# Patient Record
Sex: Female | Born: 1965 | Race: White | Hispanic: No | Marital: Married | Smoking: Never smoker
Health system: Southern US, Community
[De-identification: ages and names within clinical notes are randomized; demographics above are authoritative.]

## PROBLEM LIST (undated history)

## (undated) DIAGNOSIS — N200 Calculus of kidney: Secondary | ICD-10-CM

## (undated) DIAGNOSIS — R3129 Other microscopic hematuria: Secondary | ICD-10-CM

## (undated) DIAGNOSIS — E1165 Type 2 diabetes mellitus with hyperglycemia: Secondary | ICD-10-CM

## (undated) DIAGNOSIS — I10 Essential (primary) hypertension: Secondary | ICD-10-CM

## (undated) DIAGNOSIS — E785 Hyperlipidemia, unspecified: Secondary | ICD-10-CM

## (undated) DIAGNOSIS — Z6841 Body Mass Index (BMI) 40.0 and over, adult: Secondary | ICD-10-CM

## (undated) HISTORY — DX: Other microscopic hematuria: R31.29

## (undated) HISTORY — DX: Body Mass Index (BMI) 40.0 and over, adult: Z684

## (undated) HISTORY — DX: Morbid (severe) obesity due to excess calories: E66.01

## (undated) HISTORY — DX: Type 2 diabetes mellitus with hyperglycemia: E11.65

## (undated) HISTORY — DX: Hyperlipidemia, unspecified: E78.5

## (undated) HISTORY — PX: OTHER SURGICAL HISTORY: SHX169

## (undated) HISTORY — DX: Calculus of kidney: N20.0

## (undated) HISTORY — DX: Essential (primary) hypertension: I10

---

## 2006-08-19 ENCOUNTER — Ambulatory Visit: Payer: Self-pay | Admitting: Obstetrics & Gynecology

## 2006-08-26 ENCOUNTER — Ambulatory Visit: Payer: Self-pay | Admitting: Obstetrics & Gynecology

## 2007-02-23 ENCOUNTER — Ambulatory Visit: Payer: Self-pay | Admitting: Obstetrics & Gynecology

## 2007-02-24 ENCOUNTER — Emergency Department: Payer: Self-pay | Admitting: Emergency Medicine

## 2007-02-25 ENCOUNTER — Other Ambulatory Visit: Payer: Self-pay

## 2007-10-11 ENCOUNTER — Ambulatory Visit: Payer: Self-pay | Admitting: Obstetrics & Gynecology

## 2007-10-21 ENCOUNTER — Emergency Department: Payer: Self-pay | Admitting: Emergency Medicine

## 2007-11-10 ENCOUNTER — Ambulatory Visit: Payer: Self-pay | Admitting: Specialist

## 2008-10-18 ENCOUNTER — Ambulatory Visit: Payer: Self-pay | Admitting: Family Medicine

## 2008-10-18 DIAGNOSIS — M722 Plantar fascial fibromatosis: Secondary | ICD-10-CM | POA: Insufficient documentation

## 2008-10-18 DIAGNOSIS — I10 Essential (primary) hypertension: Secondary | ICD-10-CM | POA: Insufficient documentation

## 2008-10-18 DIAGNOSIS — M67919 Unspecified disorder of synovium and tendon, unspecified shoulder: Secondary | ICD-10-CM | POA: Insufficient documentation

## 2008-10-18 DIAGNOSIS — M719 Bursopathy, unspecified: Secondary | ICD-10-CM

## 2008-10-19 ENCOUNTER — Encounter: Payer: Self-pay | Admitting: Family Medicine

## 2008-10-22 ENCOUNTER — Encounter: Payer: Self-pay | Admitting: Family Medicine

## 2008-12-04 ENCOUNTER — Ambulatory Visit: Payer: Self-pay | Admitting: Family Medicine

## 2008-12-04 DIAGNOSIS — R109 Unspecified abdominal pain: Secondary | ICD-10-CM | POA: Insufficient documentation

## 2008-12-04 LAB — CONVERTED CEMR LAB
Bilirubin Urine: NEGATIVE
Glucose, Urine, Semiquant: NEGATIVE
Ketones, urine, test strip: NEGATIVE
Specific Gravity, Urine: 1.01

## 2008-12-05 ENCOUNTER — Encounter: Payer: Self-pay | Admitting: Family Medicine

## 2009-07-09 ENCOUNTER — Telehealth: Payer: Self-pay | Admitting: Family Medicine

## 2009-09-06 ENCOUNTER — Telehealth: Payer: Self-pay | Admitting: Family Medicine

## 2010-09-09 NOTE — Progress Notes (Signed)
Summary: pharmacy wants to change micardis  Phone Note From Pharmacy Message from:  Fax from Pharmacy  [facility]er: [facility] Summary of Call: Pharmacy wants to change pt from micardis, form is on your desk. Initial call taken by: Lowella Petties CMA,  September 06, 2009 4:36 PM  Follow-up for Phone Call        this is up to the patient, not pharmacy. ask her. Follow-up by: Hannah Beat MD,  September 07, 2009 4:59 PM  Additional Follow-up for Phone Call Additional follow up Details #1::        please ask the patient.   if she switched to losartan, her copay would be 15 dollars Additional Follow-up by: Hannah Beat MD,  September 09, 2009 2:38 PM    Additional Follow-up for Phone Call Additional follow up Details #2::    Left message for patient to call back. Lewanda Rife LPN  September 11, 2009 10:00 AM   Spoke with patient, she is ok to make the change however she wants to be sure there are no crazy or weird side effects.  She said by making the switch she can save over $100 a month but wants to be sure Dr. Patsy Lager thinks she will do well being on this medication.  Please advise. Follow-up by: Linde Gillis CMA Duncan Dull),  September 11, 2009 4:39 PM  Additional Follow-up for Phone Call Additional follow up Details #3:: Details for Additional Follow-up Action Taken: It is in the same class of drug. No different SE compared with Micardis. Impossible to know if she will do well, but it works the same as what she is currently on.   Sent 09/12/2009. 5 PM Additional Follow-up by: Hannah Beat MD,  September 11, 2009 7:19 PM  New/Updated Medications: LOSARTAN POTASSIUM 50 MG TABS (LOSARTAN POTASSIUM) 1 by mouth daily Prescriptions: LOSARTAN POTASSIUM 50 MG TABS (LOSARTAN POTASSIUM) 1 by mouth daily  #90 x 3   Entered and Authorized by:   Hannah Beat MD   Signed by:   Hannah Beat MD on 09/12/2009   Method used:   Electronically to        [facility] [address]  (mail-order)       [facility]       [address]       [place], [place]  [address]       Ph: [phone]       Fax: [phone]   RxID:   [id number]  Spoke with patient and advised her as instructed, she would like to switch to Losartan.  Please send in Rx to [facility].  Linde Gillis CMA Duncan Dull)  September 12, 2009 11:28 AM

## 2011-01-19 ENCOUNTER — Ambulatory Visit (INDEPENDENT_AMBULATORY_CARE_PROVIDER_SITE_OTHER): Payer: 59 | Admitting: Family Medicine

## 2011-01-19 ENCOUNTER — Encounter: Payer: Self-pay | Admitting: Family Medicine

## 2011-01-19 ENCOUNTER — Ambulatory Visit
Admission: RE | Admit: 2011-01-19 | Discharge: 2011-01-19 | Disposition: A | Discharge status: Home / Self Care | Payer: 59 | Source: Ambulatory Visit | Attending: Family Medicine | Admitting: Family Medicine

## 2011-01-19 DIAGNOSIS — R220 Localized swelling, mass and lump, head: Secondary | ICD-10-CM

## 2011-01-19 DIAGNOSIS — J029 Acute pharyngitis, unspecified: Secondary | ICD-10-CM | POA: Insufficient documentation

## 2011-01-19 DIAGNOSIS — R221 Localized swelling, mass and lump, neck: Secondary | ICD-10-CM | POA: Insufficient documentation

## 2011-01-19 LAB — T4, FREE: Free T4: 0.68 ng/dL (ref 0.60–1.60)

## 2011-01-19 LAB — TSH: TSH: 1.18 u[IU]/mL (ref 0.35–5.50)

## 2011-01-19 NOTE — Patient Instructions (Signed)
You have pharyngitis, likely viral. Push fluids and plenty of rest. May use ibuprofen or tylenol for throat inflammation. Salt water gargles. Suck on cold things like popsicles or warm things like herbal teas (whichever soothes the throat better). Return if fever >101.5, worsening pain, or trouble opening/closing mouth, or hoarse voice.  For swelling in neck - blood work today to check thyroid as well as pass by Joyce Rowe's office to schedule thyroid ultrasound. Good to see you today, call clinic with questions.

## 2011-01-19 NOTE — Assessment & Plan Note (Addendum)
Swelling in neck - thyromegaly vs adipose tissue but pt endorsing recent increase in swelling. Nontender on exam so don't think due to thyroiditis but will check for this. Today tachycardic and hypertensive, pt attributes to anxiety. Given neck findings, have set up with thyroid function labs as well as ultrasound to eval.

## 2011-01-19 NOTE — Assessment & Plan Note (Signed)
Rapid strep checked today - negative. Anticipate viral pharyngitis component- treat with supportive care as per instructions. See below regarding neck swelling.

## 2011-01-19 NOTE — Progress Notes (Signed)
  Subjective:    Patient ID: Joyce Rowe, female    DOB: 1966/07/16, 45 y.o.   MRN: [id number]  HPI CC: ST?  1 night h/o ST raw (last night) as well as wheezing (last night).  This in setting of last few weeks with swelling in throat, attributed to allergies.  Taking allergy medicines for this, not really helping.  No HA, fevers/chills, nausea/vomiting, diarrhea, abd pain, rashes.  No ear pain, tooth pain, sinus pain.  Denies cough, SOB.  Denies rashes, skin or hair changes, diarrhea/constipation, cold intolerance. Endorses longstanding heat intolerance.  Went to [facility] at [employer] for daughter (jellyfish sting), Dr told her that her thyroid was "inflammed" just by looking at her.  No sick contacts at home, no smokers at home.  + h/o allergies.  No h/o asthma.  HR 120 today, elevated BP.  Pt states this is norm for her when coming in to see doctor.  Has appt with PCP on Wednesday for CPX. BP Readings from Last 3 Encounters:  01/19/11 154/100  12/04/08 130/90  10/18/08 150/88   Medications and allergies reviewed and updated in chart. PMHx reviewed for relevance. Review of Systems Per HPI    Objective:   Physical Exam  Nursing note and vitals reviewed. Constitutional: She appears well-developed and well-nourished. No distress.       Anxious  HENT:  Head: Normocephalic and atraumatic.  Right Ear: Hearing, tympanic membrane, external ear and ear canal normal.  Left Ear: Hearing, tympanic membrane, external ear and ear canal normal.  Nose: Nose normal. Right sinus exhibits no maxillary sinus tenderness and no frontal sinus tenderness. Left sinus exhibits no maxillary sinus tenderness and no frontal sinus tenderness.  Mouth/Throat: Uvula is midline and oropharynx is clear and moist. No oropharyngeal exudate, posterior oropharyngeal edema, posterior oropharyngeal erythema or tonsillar abscesses.  Neck: Normal range of motion and full passive range of motion without pain. Neck supple. Edema  present. No tracheal deviation present. Thyromegaly present.       Swelling of thyroid as well as lateral to thyroid, extending down to superior to clavicles bilaterally. No LAD appreciated. nontender.  Cardiovascular: Regular rhythm.  Tachycardia present.  Exam reveals no gallop.   No murmur heard. Pulmonary/Chest: Effort normal and breath sounds normal. No respiratory distress. She has no wheezes. She has no rales.  Abdominal: Soft. Bowel sounds are normal. She exhibits no distension. There is no tenderness.  Lymphadenopathy:    She has no cervical adenopathy.  Skin: Skin is warm and dry. No rash noted.          Assessment & Plan:

## 2011-01-21 ENCOUNTER — Encounter: Payer: Self-pay | Admitting: Family Medicine

## 2011-01-21 ENCOUNTER — Ambulatory Visit (INDEPENDENT_AMBULATORY_CARE_PROVIDER_SITE_OTHER): Payer: 59 | Admitting: Family Medicine

## 2011-01-21 ENCOUNTER — Other Ambulatory Visit (HOSPITAL_COMMUNITY)
Admission: RE | Admit: 2011-01-21 | Discharge: 2011-01-21 | Disposition: A | Discharge status: Home / Self Care | Payer: 59 | Source: Ambulatory Visit | Attending: Family Medicine | Admitting: Family Medicine

## 2011-01-21 VITALS — BP 150/100 | HR 128 | Temp 98.8°F | Ht 68.0 in | Wt 285.4 lb

## 2011-01-21 DIAGNOSIS — Z1231 Encounter for screening mammogram for malignant neoplasm of breast: Secondary | ICD-10-CM

## 2011-01-21 DIAGNOSIS — Z01419 Encounter for gynecological examination (general) (routine) without abnormal findings: Secondary | ICD-10-CM

## 2011-01-21 DIAGNOSIS — Z0000 Encounter for general adult medical examination without abnormal findings: Secondary | ICD-10-CM | POA: Insufficient documentation

## 2011-01-21 MED ORDER — LOSARTAN POTASSIUM 50 MG PO TABS: 50.0000 mg | ORAL_TABLET | Freq: Every day | ORAL | Status: DC

## 2011-01-21 NOTE — Patient Instructions (Signed)
REFERRAL: GO THE THE FRONT ROOM AT THE ENTRANCE OF OUR CLINIC, NEAR CHECK IN. ASK FOR Joyce Rowe. SHE WILL HELP YOU SET UP YOUR REFERRAL. DATE: TIME:  

## 2011-01-21 NOTE — Progress Notes (Signed)
45 year old female, CPX:  Htn: 120/80 at home.   Pap Tdap Mammogram  Thyrmegaly?  U/s of thyroid is normal. Lab Results  Component Value Date   TSH 1.18 01/19/2011   Health Maintenance Summary Reviewed and updated, unless pt declines services.  Tobacco History Reviewed. Non-smoker Alcohol: No concerns, no excessive use Exercise Habits: Some activity, rec at least 30 mins 5 times a week (none now) STD concerns: none Drug Use: None Birth control method: Menses regular: yes Lumps or breast concerns: no Breast Cancer Family History: no  General: Denies fever, chills, sweats. No significant weight loss. Eyes: Denies blurring,significant itching ENT: SORE THROAT IS IMPROVING Cardiovascular: Denies chest pains, palpitations, dyspnea on exertion,  Respiratory: Denies cough, dyspnea at rest,wheeezing Breast: no concerns about lumps GI: Denies nausea, vomiting, diarrhea, constipation, change in bowel habits, abdominal pain, melena, hematochezia GU: Denies dysuria, hematuria, urinary hesitancy, nocturia, denies STD risk, no concerns about discharge Musculoskeletal: Denies back pain, joint pain Derm: Denies rash, itching Neuro: Denies  paresthesias, frequent falls, frequent headaches Psych: Denies depression, anxiety Endocrine: Denies cold intolerance, heat intolerance, polydipsia Heme: Denies enlarged lymph nodes Allergy: +/- hayfever   Physical Exam  Blood pressure 150/100, pulse 128, temperature 98.8 F (37.1 C), temperature source Oral, height 5\' 8"  (1.727 m), weight 285 lb 6.4 oz (129.457 kg), last menstrual period 12/27/2010, SpO2 99.00%.  GEN: well developed, well nourished, no acute distress Eyes: conjunctiva and lids normal, PERRLA, EOMI ENT: TM clear, nares clear, oral exam WNL Neck: supple, no lymphadenopathy, no thyromegaly, no JVD Pulm: clear to auscultation and percussion, respiratory effort normal CV: regular rate and rhythm, S1-S2, no murmur, rub or gallop, no  bruits, peripheral pulses normal and symmetric, no cyanosis, clubbing, edema or varicosities Chest: no scars, masses, no gynecomastia   BREAST: no lumps, no axillary LAD, no nipple discharge GI: soft, non-tender; no hepatosplenomegaly, masses; active bowel sounds all quadrants GU: Normal external female genitalia. Cervix appears intact without lesions or irritation. Vaginal canal normal without ulceration or lesion. Cervix NT to exam. Ovaries neither enlarged nor tender. Lymph: no cervical, axillary or inguinal adenopathy MSK: gait normal, muscle tone and strength WNL, no joint swelling, effusions, discoloration, crepitus  SKIN: clear, good turgor, color WNL, no rashes, lesions, or ulcerations Neuro: normal mental status, normal strength, sensation, and motion Psych: alert; oriented to person, place and time. VERY ANXIOUS  A/p: The patient's preventative maintenance and recommended screening tests for an annual wellness exam were reviewed in full today. Brought up to date unless services declined.  Counselled on the importance of diet, exercise, and its role in overall health and mortality. The patient's FH and SH was reviewed, including their home life, tobacco status, and drug and alcohol status.  Mammo, Pap FLP, CBC, CMP ordered for [facility] draw  Thyroid work-up ended up all being normal  HTN: refill meds, will recheck at home over the next few weeks, call me if > 140/90

## 2011-01-23 ENCOUNTER — Ambulatory Visit: Payer: Self-pay | Admitting: Family Medicine

## 2011-01-23 ENCOUNTER — Encounter: Payer: Self-pay | Admitting: *Deleted

## 2011-01-28 ENCOUNTER — Encounter: Payer: Self-pay | Admitting: Family Medicine

## 2013-03-01 ENCOUNTER — Encounter: Payer: Self-pay | Admitting: Family Medicine

## 2013-03-01 ENCOUNTER — Ambulatory Visit (INDEPENDENT_AMBULATORY_CARE_PROVIDER_SITE_OTHER): Payer: 59 | Admitting: Family Medicine

## 2013-03-01 VITALS — BP 160/100 | HR 112 | Temp 98.3°F | Ht 68.0 in | Wt 293.2 lb

## 2013-03-01 DIAGNOSIS — I10 Essential (primary) hypertension: Secondary | ICD-10-CM

## 2013-03-01 MED ORDER — HYDROCHLOROTHIAZIDE 12.5 MG PO TABS: 12.5000 mg | ORAL_TABLET | Freq: Every day | ORAL | Status: DC

## 2013-03-01 NOTE — Progress Notes (Signed)
Nature conservation officer at [facility] [address] [place] [address] Phone: [phone] Fax: [phone]  Date:  03/01/2013   Name:  Joyce Rowe   DOB:  25-Jan-1966   MRN:  [id number] Gender: female Age: 47 y.o.  Primary Physician:  Hannah Beat, MD  Evaluating MD: Hannah Beat, MD   Chief Complaint: Blood Pressure Check   History of Present Illness:  Joyce Rowe is a 47 y.o. pleasant patient who presents with the following:  Lost pounds this week. 144/94 - stopped BP meds at least 1-2 years.  micardis was working.  No ha or other symptoms.  Highly anxious today.   Patient Active Problem List   Diagnosis Date Noted  . Other screening mammogram 01/21/2011  . Routine general medical examination at a health care facility 01/21/2011  . HYPERTENSION 10/18/2008  . ROTATOR CUFF SYNDROME, RIGHT 10/18/2008  . PLANTAR FASCIITIS, RIGHT 10/18/2008    Past Medical History  Diagnosis Date  . Unspecified essential hypertension     Past Surgical History  Procedure Laterality Date  . Cesarean section  08/2002    History   Social History  . Marital Status: Married    Spouse Name: N/A    Number of Children: 1  . Years of Education: N/A   Occupational History  . BS, SAHM    Social History Main Topics  . Smoking status: Never Smoker   . Smokeless tobacco: Not on file  . Alcohol Use: No  . Drug Use: No  . Sexually Active: Not on file   Other Topics Concern  . Not on file   Social History Narrative   Married      1 child      BS, SAHM      6-8 hours sleep per night      3 people living in home      Regular exercise-yes    Family History  Problem Relation Age of Onset  . Colon cancer      Grandparents  <60  . Hypertension Mother   . Hypertension Father   . Heart disease      Grandparents    Allergies  Allergen Reactions  . Metaxalone     REACTION: Rash  . Naproxen     REACTION: Rash    Medication list has been reviewed and  updated.  Outpatient Prescriptions Prior to Visit  Medication Sig Dispense Refill  . Multiple Vitamin (MULTIVITAMIN) capsule Take 1 capsule by mouth daily.        Marland Kitchen losartan (COZAAR) 50 MG tablet Take 1 tablet (50 mg total) by mouth daily.  30 tablet  0   No facility-administered medications prior to visit.    Review of Systems:   GEN: No acute illnesses, no fevers, chills. GI: No n/v/d, eating normally Pulm: No SOB Interactive and getting along well at home.  Otherwise, ROS is as per the HPI.   Physical Examination: BP 160/100  Pulse 112  Temp(Src) 98.3 F (36.8 C) (Oral)  Ht 5\' 8"  (1.727 m)  Wt 293 lb 4 oz (133.017 kg)  BMI 44.6 kg/m2  SpO2 98%  Ideal Body Weight: Weight in (lb) to have BMI = 25: 164.1   GEN: WDWN, NAD, Non-toxic, A & O x 3 HEENT: Atraumatic, Normocephalic. Neck supple. No masses, No LAD. Ears and Nose: No external deformity. CV: RRR, No M/G/R. No JVD. No thrill. No extra heart sounds. PULM: CTA B, no wheezes, crackles, rhonchi. No retractions. No resp. distress.  No accessory muscle use. EXTR: No c/c/e NEURO Normal gait.  PSYCH: anxious appearing.    Assessment and Plan:  HYPERTENSION  Start, f/u cpx  Orders Today:  No orders of the defined types were placed in this encounter.    Updated Medication List: (Includes new medications, updates to list, dose adjustments) Meds ordered this encounter  Medications  . hydrochlorothiazide (HYDRODIURIL) 12.5 MG tablet    Sig: Take 1 tablet (12.5 mg total) by mouth daily.    Dispense:  30 tablet    Refill:  3    Medications Discontinued: Medications Discontinued During This Encounter  Medication Reason  . losartan (COZAAR) 50 MG tablet Error      Signed, Secundino Ellithorpe T. Clothilde Tippetts, MD 03/01/2013 8:35 AM

## 2013-03-01 NOTE — Patient Instructions (Addendum)
F/u 3-6 weeks for full CPX

## 2013-03-17 ENCOUNTER — Ambulatory Visit: Payer: Self-pay | Admitting: Family Medicine

## 2013-03-20 ENCOUNTER — Encounter: Payer: Self-pay | Admitting: Family Medicine

## 2013-03-22 ENCOUNTER — Encounter: Payer: Self-pay | Admitting: *Deleted

## 2013-03-22 ENCOUNTER — Encounter: Payer: Self-pay | Admitting: Family Medicine

## 2013-03-22 LAB — HM MAMMOGRAPHY: HM Mammogram: NORMAL

## 2013-03-30 ENCOUNTER — Other Ambulatory Visit: Payer: Self-pay | Admitting: Family Medicine

## 2013-03-30 ENCOUNTER — Telehealth: Payer: Self-pay

## 2013-03-30 MED ORDER — NONFORMULARY OR COMPOUNDED ITEM: Status: DC

## 2013-03-30 NOTE — Telephone Encounter (Signed)
Pt left v/m; has f/u appt on 04/12/13 for CPX and pt was to bring labs recently done to that appt. Pt cannot obtain those lab results and pt request written lab order for pt to have labs done at [facility] prior to CPX.Please advise.

## 2013-03-30 NOTE — Telephone Encounter (Signed)
done

## 2013-03-30 NOTE — Telephone Encounter (Signed)
Left message on voice mail asking patient to call me back.

## 2013-03-30 NOTE — Telephone Encounter (Signed)
Order placed at front desk, patient will pick up.

## 2013-04-07 ENCOUNTER — Other Ambulatory Visit: Payer: Self-pay | Admitting: Family Medicine

## 2013-04-08 LAB — BASIC METABOLIC PANEL
BUN: 14 mg/dL (ref 6–24)
CO2: 22 mmol/L (ref 18–29)
Calcium: 9.6 mg/dL (ref 8.7–10.2)
Chloride: 97 mmol/L (ref 97–108)
Glucose: 129 mg/dL — ABNORMAL HIGH (ref 65–99)
Potassium: 4.7 mmol/L (ref 3.5–5.2)

## 2013-04-08 LAB — CBC WITH DIFFERENTIAL
Basophils Absolute: 0 10*3/uL (ref 0.0–0.2)
HCT: 44.4 % (ref 34.0–46.6)
Immature Grans (Abs): 0 10*3/uL (ref 0.0–0.1)
Immature Granulocytes: 0 % (ref 0–2)
Lymphs: 37 % (ref 14–46)
Monocytes: 8 % (ref 4–12)
Neutrophils Absolute: 4.4 10*3/uL (ref 1.4–7.0)
Neutrophils Relative %: 52 % (ref 40–74)
Platelets: 299 10*3/uL (ref 150–379)
RDW: 13.9 % (ref 12.3–15.4)
WBC: 8.5 10*3/uL (ref 3.4–10.8)

## 2013-04-08 LAB — LIPID PANEL W/O CHOL/HDL RATIO
Cholesterol, Total: 259 mg/dL — ABNORMAL HIGH (ref 100–199)
Triglycerides: 356 mg/dL — ABNORMAL HIGH (ref 0–149)

## 2013-04-08 LAB — HEPATIC FUNCTION PANEL
ALT: 38 IU/L — ABNORMAL HIGH (ref 0–32)
Alkaline Phosphatase: 62 IU/L (ref 39–117)
Bilirubin, Direct: 0.11 mg/dL (ref 0.00–0.40)
Total Bilirubin: 0.5 mg/dL (ref 0.0–1.2)

## 2013-04-12 ENCOUNTER — Encounter: Payer: Self-pay | Admitting: Family Medicine

## 2013-04-12 ENCOUNTER — Ambulatory Visit (INDEPENDENT_AMBULATORY_CARE_PROVIDER_SITE_OTHER): Payer: 59 | Admitting: Family Medicine

## 2013-04-12 VITALS — BP 176/100 | HR 129 | Temp 98.4°F | Ht 67.0 in | Wt 285.5 lb

## 2013-04-12 DIAGNOSIS — Z0000 Encounter for general adult medical examination without abnormal findings: Secondary | ICD-10-CM

## 2013-04-12 MED ORDER — NONFORMULARY OR COMPOUNDED ITEM: Status: DC

## 2013-04-12 NOTE — Progress Notes (Signed)
Nature conservation officer at [facility] [address] [place] [address] Phone: [phone] Fax: [phone]  Date:  04/12/2013   Name:  Joyce Rowe   DOB:  05-13-1966   MRN:  [id number] Gender: female Age: 47 y.o.  Primary Physician:  Hannah Beat, MD  Evaluating MD: Hannah Beat, MD   Chief Complaint: Annual Exam   History of Present Illness:  Joyce Rowe is a 47 y.o. pleasant patient who presents with the following:  CPX:  148/92.  Working things - changing eating habits.  Playing tennis.  Wt Readings from Last 3 Encounters:  04/12/13 285 lb 8 oz (129.502 kg)  03/01/13 293 lb 4 oz (133.017 kg)  01/21/11 285 lb 6.4 oz (129.457 kg)   Health Maintenance Summary Reviewed and updated, unless pt declines services.  Tobacco History Reviewed. Non-smoker Alcohol: No concerns, no excessive use Exercise Habits: Some activity, rec at least 30 mins 5 times a week STD concerns: none Drug Use: None Menses regular: yes Lumps or breast concerns: no Breast Cancer Family History: no  Health Maintenance  Topic Date Due  . Tetanus/tdap  01/05/1987  . Influenza Vaccine  03/10/2013  . Pap Smear  01/20/2014  . Mammogram  03/22/2014    Labs reviewed with the patient.  Results for orders placed in visit on 04/07/13  CBC WITH DIFFERENTIAL/PLATELET      Result Value Range   WBC 8.5  3.4 - 10.8 x10E3/uL   RBC 5.34 (*) 3.77 - 5.28 x10E6/uL   Hemoglobin 15.5  11.1 - 15.9 g/dL   HCT 40.3  47.4 - 25.9 %   MCV 83  79 - 97 fL   MCH 29.0  26.6 - 33.0 pg   MCHC 34.9  31.5 - 35.7 g/dL   RDW 56.3  87.5 - 64.3 %   Platelets 299  150 - 379 x10E3/uL   Neutrophils Relative % 52  40 - 74 %   Lymphs 37  14 - 46 %   Monocytes 8  4 - 12 %   Eos 3  0 - 5 %   Basos 0  0 - 3 %   Neutrophils Absolute 4.4  1.4 - 7.0 x10E3/uL   Lymphocytes Absolute 3.2 (*) 0.7 - 3.1 x10E3/uL   Monocytes Absolute 0.7  0.1 - 0.9 x10E3/uL   Eosinophils Absolute 0.3  0.0 - 0.4 x10E3/uL   Basophils  Absolute 0.0  0.0 - 0.2 x10E3/uL   Immature Granulocytes 0  0 - 2 %   Immature Grans (Abs) 0.0  0.0 - 0.1 x10E3/uL  BASIC METABOLIC PANEL      Result Value Range   Glucose 129 (*) 65 - 99 mg/dL   BUN 14  6 - 24 mg/dL   Creatinine, Ser 3.29  0.57 - 1.00 mg/dL   GFR calc non Af Amer 92  >59 mL/min/1.73   GFR calc Af Amer 106  >59 mL/min/1.73   BUN/Creatinine Ratio 18  9 - 23   Sodium 141  134 - 144 mmol/L   Potassium 4.7  3.5 - 5.2 mmol/L   Chloride 97  97 - 108 mmol/L   CO2 22  18 - 29 mmol/L   Calcium 9.6  8.7 - 10.2 mg/dL  LIPID PANEL W/O CHOL/HDL RATIO      Result Value Range   Cholesterol, Total 259 (*) 100 - 199 mg/dL   Triglycerides 518 (*) 0 - 149 mg/dL   HDL 42  >84 mg/dL   VLDL Cholesterol Cal  71 (*) 5 - 40 mg/dL   LDL Calculated 782 (*) 0 - 99 mg/dL  HEPATIC FUNCTION PANEL      Result Value Range   Total Protein 6.7  6.0 - 8.5 g/dL   Albumin 4.4  3.5 - 5.5 g/dL   Total Bilirubin 0.5  0.0 - 1.2 mg/dL   Bilirubin, Direct 9.56  0.00 - 0.40 mg/dL   Alkaline Phosphatase 62  39 - 117 IU/L   AST 29  0 - 40 IU/L   ALT 38 (*) 0 - 32 IU/L     Patient Active Problem List   Diagnosis Date Noted  . Other screening mammogram 01/21/2011  . HYPERTENSION 10/18/2008  . ROTATOR CUFF SYNDROME, RIGHT 10/18/2008  . PLANTAR FASCIITIS, RIGHT 10/18/2008    Past Medical History  Diagnosis Date  . Unspecified essential hypertension     Past Surgical History  Procedure Laterality Date  . Cesarean section  08/2002    History   Social History  . Marital Status: Married    Spouse Name: N/A    Number of Children: 1  . Years of Education: N/A   Occupational History  . BS, SAHM    Social History Main Topics  . Smoking status: Never Smoker   . Smokeless tobacco: Never Used  . Alcohol Use: No  . Drug Use: No  . Sexual Activity: Not on file   Other Topics Concern  . Not on file   Social History Narrative   Married      1 child      BS, SAHM      6-8 hours sleep per  night      3 people living in home      Regular exercise-yes    Family History  Problem Relation Age of Onset  . Colon cancer      Grandparents  <60  . Hypertension Mother   . Hypertension Father   . Heart disease      Grandparents    Allergies  Allergen Reactions  . Metaxalone     REACTION: Rash  . Naproxen     REACTION: Rash    Medication list has been reviewed and updated.  Outpatient Prescriptions Prior to Visit  Medication Sig Dispense Refill  . hydrochlorothiazide (HYDRODIURIL) 12.5 MG tablet Take 1 tablet (12.5 mg total) by mouth daily.  30 tablet  3  . Multiple Vitamin (MULTIVITAMIN) capsule Take 1 capsule by mouth daily.        . NONFORMULARY OR COMPOUNDED ITEM Epic Account: [id number] Lab Studies: BMP, CBC with diff, HFP: v58.69 FLP: 272.4  1 each  0   No facility-administered medications prior to visit.    Review of Systems:   General: Denies fever, chills, sweats. No significant weight loss. Eyes: Denies blurring,significant itching ENT: Denies earache, sore throat, and hoarseness.  Cardiovascular: Denies chest pains, palpitations, dyspnea on exertion,  Respiratory: Denies cough, dyspnea at rest,wheeezing Breast: no concerns about lumps GI: Denies nausea, vomiting, diarrhea, constipation, change in bowel habits, abdominal pain, melena, hematochezia GU: Denies dysuria, hematuria, urinary hesitancy, nocturia, denies STD risk, no concerns about discharge Musculoskeletal: Denies back pain, joint pain Derm: Denies rash, itching Neuro: Denies  paresthesias, frequent falls, frequent headaches Psych: Denies depression, anxiety Endocrine: Denies cold intolerance, heat intolerance, polydipsia Heme: Denies enlarged lymph nodes Allergy: No hayfever   Physical Examination: BP 176/100  Pulse 129  Temp(Src) 98.4 F (36.9 C) (Oral)  Ht 5\' 7"  (1.702 m)  Wt 285 lb 8  oz (129.502 kg)  BMI 44.71 kg/m2  LMP 03/27/2013  Ideal Body Weight: Weight in (lb) to have  BMI = 25: 159.3   GEN: well developed, well nourished, no acute distress Eyes: conjunctiva and lids normal, PERRLA, EOMI ENT: TM clear, nares clear, oral exam WNL Neck: supple, no lymphadenopathy, no thyromegaly, no JVD Pulm: clear to auscultation and percussion, respiratory effort normal CV: regular rate and rhythm, S1-S2, no murmur, rub or gallop, no bruits Chest: no scars, masses, no lumps BREAST: chaperoned, WNL. No lumps or masses GI: soft, non-tender; no hepatosplenomegaly, masses; active bowel sounds all quadrants GU: GU exam declined Lymph: no cervical, axillary or inguinal adenopathy MSK: gait normal, muscle tone and strength WNL, no joint swelling, effusions, discoloration, crepitus  SKIN: clear, good turgor, color WNL, no rashes, lesions, or ulcerations Neuro: normal mental status, normal strength, sensation, and motion Psych: alert; oriented to person, place and time, normally interactive and not anxious or depressed in appearance.    Assessment and Plan:  Routine general medical examination at a health care facility  The patient's preventative maintenance and recommended screening tests for an annual wellness exam were reviewed in full today. Brought up to date unless services declined.  Counselled on the importance of diet, exercise, and its role in overall health and mortality. The patient's FH and SH was reviewed, including their home life, tobacco status, and drug and alcohol status.   F/u a1c was 6.0 Work on wt loss  Orders Today:  No orders of the defined types were placed in this encounter.    Updated Medication List: (Includes new medications, updates to list, dose adjustments) Meds ordered this encounter  Medications  . Omega-3 Fatty Acids (FISH OIL) 1000 MG CAPS    Sig: Take 1 capsule by mouth daily.  . NONFORMULARY OR COMPOUNDED ITEM    Sig: Epic Account: [id number] Lab Studies: Hemoglobin a1c: 250.00    Dispense:  1 each    Refill:  0     Medications Discontinued: Medications Discontinued During This Encounter  Medication Reason  . NONFORMULARY OR COMPOUNDED ITEM Reorder      Signed, Karleen Hampshire T. Tylar Merendino, MD 04/12/2013 3:07 PM

## 2013-04-13 ENCOUNTER — Other Ambulatory Visit: Payer: Self-pay | Admitting: Family Medicine

## 2013-04-13 LAB — HGB A1C W/O EAG: Hgb A1c MFr Bld: 6 % — ABNORMAL HIGH (ref 4.8–5.6)

## 2013-06-15 ENCOUNTER — Other Ambulatory Visit: Payer: Self-pay

## 2013-07-05 ENCOUNTER — Other Ambulatory Visit: Payer: Self-pay | Admitting: Family Medicine

## 2014-01-06 ENCOUNTER — Other Ambulatory Visit: Payer: Self-pay | Admitting: Family Medicine

## 2014-03-14 ENCOUNTER — Telehealth: Payer: Self-pay | Admitting: Family Medicine

## 2014-03-14 ENCOUNTER — Other Ambulatory Visit: Payer: Self-pay | Admitting: Family Medicine

## 2014-03-14 MED ORDER — NONFORMULARY OR COMPOUNDED ITEM: Status: DC

## 2014-03-14 NOTE — Telephone Encounter (Signed)
Klaire notified lab orders are ready to be picked up at front desk.

## 2014-03-14 NOTE — Telephone Encounter (Signed)
Pt dropped off sheet for lab work requests orders to be put in the computer system for her to have her CPE labs drawn at [facility] prior to her CPE visit coming up. Pt states that the sheet lists labs that she requests to have drawn. Any questions please call pt at #[phone]. Papers placed on Donna's desk to give to Dr Patsy Lageropland. Thank you!

## 2014-03-14 NOTE — Telephone Encounter (Signed)
This was done.

## 2014-03-23 ENCOUNTER — Other Ambulatory Visit: Payer: Self-pay | Admitting: Family Medicine

## 2014-03-24 LAB — CBC WITH DIFFERENTIAL
Basophils Absolute: 0 10*3/uL (ref 0.0–0.2)
Basos: 0 %
Eos: 2 %
Eosinophils Absolute: 0.2 10*3/uL (ref 0.0–0.4)
HCT: 45.9 % (ref 34.0–46.6)
Hemoglobin: 16.2 g/dL — ABNORMAL HIGH (ref 11.1–15.9)
Immature Grans (Abs): 0 10*3/uL (ref 0.0–0.1)
Immature Granulocytes: 0 %
Lymphocytes Absolute: 3.6 10*3/uL — ABNORMAL HIGH (ref 0.7–3.1)
Lymphs: 41 %
MCH: 29.6 pg (ref 26.6–33.0)
MCHC: 35.3 g/dL (ref 31.5–35.7)
MCV: 84 fL (ref 79–97)
Monocytes Absolute: 0.7 10*3/uL (ref 0.1–0.9)
Monocytes: 8 %
Neutrophils Absolute: 4.2 10*3/uL (ref 1.4–7.0)
Neutrophils Relative %: 49 %
Platelets: 309 10*3/uL (ref 150–379)
RBC: 5.47 x10E6/uL — ABNORMAL HIGH (ref 3.77–5.28)
RDW: 13.8 % (ref 12.3–15.4)
WBC: 8.7 10*3/uL (ref 3.4–10.8)

## 2014-03-24 LAB — BASIC METABOLIC PANEL
BUN / CREAT RATIO: 22 (ref 9–23)
BUN: 16 mg/dL (ref 6–24)
CALCIUM: 10 mg/dL (ref 8.7–10.2)
CO2: 25 mmol/L (ref 18–29)
CREATININE: 0.73 mg/dL (ref 0.57–1.00)
Chloride: 96 mmol/L — ABNORMAL LOW (ref 97–108)
GFR calc Af Amer: 114 mL/min/{1.73_m2} (ref >59)
GFR calc non Af Amer: 99 mL/min/{1.73_m2} (ref >59)
Glucose: 132 mg/dL — ABNORMAL HIGH (ref 65–99)
Potassium: 4.6 mmol/L (ref 3.5–5.2)
Sodium: 139 mmol/L (ref 134–144)

## 2014-03-24 LAB — HEPATIC FUNCTION PANEL
ALK PHOS: 66 IU/L (ref 39–117)
ALT: 31 IU/L (ref 0–32)
AST: 29 IU/L (ref 0–40)
Albumin: 4.3 g/dL (ref 3.5–5.5)
Bilirubin, Direct: 0.11 mg/dL (ref 0.00–0.40)
TOTAL PROTEIN: 7.2 g/dL (ref 6.0–8.5)
Total Bilirubin: 0.5 mg/dL (ref 0.0–1.2)

## 2014-03-24 LAB — LIPID PANEL W/O CHOL/HDL RATIO
Cholesterol, Total: 289 mg/dL — ABNORMAL HIGH (ref 100–199)
HDL: 46 mg/dL (ref >39)
LDL Calculated: 165 mg/dL — ABNORMAL HIGH (ref 0–99)
TRIGLYCERIDES: 392 mg/dL — AB (ref 0–149)
VLDL Cholesterol Cal: 78 mg/dL — ABNORMAL HIGH (ref 5–40)

## 2014-03-24 LAB — HGB A1C W/O EAG: HEMOGLOBIN A1C: 6.3 % — AB (ref 4.8–5.6)

## 2014-03-24 LAB — PSA: PSA: <0.1 ng/mL

## 2014-03-24 LAB — TSH: TSH: 1.19 u[IU]/mL (ref 0.450–4.500)

## 2014-03-28 ENCOUNTER — Encounter: Payer: Self-pay | Admitting: Family Medicine

## 2014-03-28 ENCOUNTER — Telehealth: Payer: Self-pay

## 2014-03-28 ENCOUNTER — Other Ambulatory Visit (HOSPITAL_COMMUNITY)
Admission: RE | Admit: 2014-03-28 | Discharge: 2014-03-28 | Disposition: A | Discharge status: Home / Self Care | Payer: 59 | Source: Ambulatory Visit | Attending: Family Medicine | Admitting: Family Medicine

## 2014-03-28 ENCOUNTER — Ambulatory Visit (INDEPENDENT_AMBULATORY_CARE_PROVIDER_SITE_OTHER): Payer: 59 | Admitting: Family Medicine

## 2014-03-28 VITALS — BP 166/82 | HR 120 | Temp 98.3°F | Ht 67.0 in | Wt 280.8 lb

## 2014-03-28 DIAGNOSIS — I10 Essential (primary) hypertension: Secondary | ICD-10-CM

## 2014-03-28 DIAGNOSIS — Z23 Encounter for immunization: Secondary | ICD-10-CM

## 2014-03-28 DIAGNOSIS — Z124 Encounter for screening for malignant neoplasm of cervix: Secondary | ICD-10-CM

## 2014-03-28 DIAGNOSIS — Z1151 Encounter for screening for human papillomavirus (HPV): Secondary | ICD-10-CM | POA: Insufficient documentation

## 2014-03-28 DIAGNOSIS — Z0000 Encounter for general adult medical examination without abnormal findings: Secondary | ICD-10-CM

## 2014-03-28 DIAGNOSIS — E785 Hyperlipidemia, unspecified: Secondary | ICD-10-CM

## 2014-03-28 DIAGNOSIS — Z6841 Body Mass Index (BMI) 40.0 and over, adult: Secondary | ICD-10-CM

## 2014-03-28 HISTORY — DX: Morbid (severe) obesity due to excess calories: E66.01

## 2014-03-28 MED ORDER — NONFORMULARY OR COMPOUNDED ITEM: Status: DC

## 2014-03-28 MED ORDER — HYDROCHLOROTHIAZIDE 25 MG PO TABS: ORAL_TABLET | ORAL | Status: DC

## 2014-03-28 MED ORDER — PRAVASTATIN SODIUM 40 MG PO TABS: 40.0000 mg | ORAL_TABLET | Freq: Every day | ORAL | Status: DC

## 2014-03-28 NOTE — Progress Notes (Signed)
_0  Subjective:   History of Present Illness:  Joyce Rowe is a 48 y.o. pleasant patient who presents with the following:  Health Maintenance Summary Reviewed and updated, unless pt declines services.  Tobacco History Reviewed. Non-smoker Alcohol: No concerns, no excessive use Exercise Habits: Some activity, rec at least 30 mins 5 times a week STD concerns: none Drug Use: None Menses regular: yes Lumps or breast concerns: no Breast Cancer Family History: no  Health Maintenance  Topic Date Due  . Tetanus/tdap  01/05/1987  . Pap Smear  01/20/2014  . Mammogram  03/22/2014  . Influenza Vaccine  03/10/2014     Immunization History  Administered Date(s) Administered  . Tdap 03/28/2014   Patient Active Problem List   Diagnosis Date Noted  . Other screening mammogram 01/21/2011  . HYPERTENSION 10/18/2008  . ROTATOR CUFF SYNDROME, RIGHT 10/18/2008  . PLANTAR FASCIITIS, RIGHT 10/18/2008   Past Medical History  Diagnosis Date  . Unspecified essential hypertension    Past Surgical History  Procedure Laterality Date  . Cesarean section  08/2002   History   Social History  . Marital Status: Married    Spouse Name: N/A    Number of Children: 1  . Years of Education: N/A   Occupational History  . BS, SAHM    Social History Main Topics  . Smoking status: Never Smoker   . Smokeless tobacco: Never Used  . Alcohol Use: No  . Drug Use: No  . Sexual Activity: Not on file   Other Topics Concern  . Not on file   Social History Narrative   Married      1 child      BS, SAHM      6-8 hours sleep per night      3 people living in home      Regular exercise-yes   Family History  Problem Relation Age of Onset  . Colon cancer      Grandparents  <60  . Hypertension Mother   . Hypertension Father   . Heart disease      Grandparents   Allergies  Allergen Reactions  . Metaxalone     REACTION: Rash  . Naproxen     REACTION: Rash    Medication list has  been reviewed and updated.  Review of Systems:  General: Denies fever, chills, sweats. No significant weight loss. Eyes: Denies blurring,significant itching ENT: Denies earache, sore throat, and hoarseness.  Cardiovascular: Denies chest pains, palpitations, dyspnea on exertion,  Respiratory: Denies cough, dyspnea at rest,wheeezing Breast: no concerns about lumps GI: Denies nausea, vomiting, diarrhea, constipation, change in bowel habits, abdominal pain, melena, hematochezia GU: Denies dysuria, hematuria, urinary hesitancy, nocturia, denies STD risk, no concerns about discharge Musculoskeletal: Denies back pain, joint pain Derm: Denies rash, itching Neuro: Denies  paresthesias, frequent falls, frequent headaches Psych: ACUTELY ANXIOUS Endocrine: Denies cold intolerance, heat intolerance, polydipsia Heme: Denies enlarged lymph nodes Allergy: No hayfever  Objective:   Physical Examination: BP 166/82  Pulse 120  Temp(Src) 98.3 F (36.8 C) (Oral)  Ht _1  (1.702 m)  Wt 280 lb 12 oz (127.347 kg)  BMI 43.96 kg/m2  LMP 03/17/2014 No exam data present  GEN: well developed, well nourished, no acute distress Eyes: conjunctiva and lids normal, PERRLA, EOMI ENT: TM clear, nares clear, oral exam WNL Neck: supple, no lymphadenopathy, no thyromegaly, no JVD Pulm: clear to auscultation and percussion, respiratory effort normal CV: regular rate and rhythm, S1-S2, no murmur, rub or gallop,  no bruits Chest: no scars, masses, no lumps BREAST: no lumps, no axillary LAD, no nipple discharge GI: soft, non-tender; no hepatosplenomegaly, masses; active bowel sounds all quadrants GU: Normal external female genitalia. Cervix appears intact without lesions or irritation. Vaginal canal normal without ulceration or lesion. Cervix NT to exam. Ovaries neither enlarged nor tender. (Chaperoned examination by female staff) This portion of the physical examination was chaperoned by Hedy Camara, [place].    Lymph: no cervical, axillary or inguinal adenopathy MSK: gait normal, muscle tone and strength WNL, no joint swelling, effusions, discoloration, crepitus  SKIN: clear, good turgor, color WNL, no rashes, lesions, or ulcerations Neuro: normal mental status, normal strength, sensation, and motion Psych: alert; oriented to person, place and time, HIGHLY ANXIOUS, SWEATING. All labs reviewed with patient. Lipids:    Component Value Date/Time   TRIG 392* 03/23/2014 0900   HDL 46 03/23/2014 0900   CBC: CBC Latest Ref Rng 03/23/2014 04/07/2013  WBC 3.4 - 10.8 x10E3/uL 8.7 8.5  Hemoglobin 11.1 - 15.9 g/dL 16.2(H) 15.5  Hematocrit 34.0 - 46.6 % 45.9 44.4  Platelets 150 - 379 x10E3/uL 309 720    Basic Metabolic Panel:    Component Value Date/Time   NA 139 03/23/2014 0900   K 4.6 03/23/2014 0900   CL 96* 03/23/2014 0900   CO2 25 03/23/2014 0900   BUN 16 03/23/2014 0900   CREATININE 0.73 03/23/2014 0900   GLUCOSE 132* 03/23/2014 0900   CALCIUM 10.0 03/23/2014 0900   Hepatic Function Latest Ref Rng 03/23/2014 04/07/2013  Total Protein 6.0 - 8.5 g/dL 7.2 6.7  AST 0 - 40 IU/L 29 29  ALT 0 - 32 IU/L 31 38(H)  Alk Phosphatase 39 - 117 IU/L 66 62  Total Bilirubin 0.0 - 1.2 mg/dL 0.5 0.5  Bilirubin, Direct 0.00 - 0.40 mg/dL 0.11 0.11    Lab Results  Component Value Date   TSH 1.190 03/23/2014   No results found.   Assessment and Plan:    Routine general medical examination at a health care facility  Other and unspecified hyperlipidemia - Plan: CANCELED: Hepatic function panel: Body mass index is 43.96 kg/(m^2).  omment the patient also has a BMI of 44. We discussed various options including weight loss, and given the total cholesterol and in the entire picture, recommended she start medical management. She agreed. We're going to also recheck a lipid panel and a liver panel in 3 months.  Need for prophylactic vaccination with combined diphtheria-tetanus-pertussis (DTP) vaccine - Plan: Tdap  vaccine greater than or equal to 7yo IM  Screening for malignant neoplasm of the cervix - Plan: Cytology - PAP [facility]  HYPERTENSION: Her blood pressure is quite elevated today, she has a cuff at home, she reports that it is quite normal at home. She is adamant that is not normally this high. After some discussion, I've asked her to check her blood pressure home every day, and if it is elevated persistently above 140/90 to please call us, we'll increase her blood pressure medication.   Health Maintenance Exam: The patient's preventative maintenance and recommended screening tests for an annual wellness exam were reviewed in full today. Brought up to date unless services declined.  Counselled on the importance of diet, exercise, and its role in overall health and mortality. The patient's FH and SH was reviewed, including their home life, tobacco status, and drug and alcohol status.  Follow-up: No Follow-up on file. Or follow-up in 1 year at [facility].  New Prescriptions  NONFORMULARY OR COMPOUNDED ITEM    Epic Account: [id number] Lab Studies FLP, HFP: 272.4   PRAVASTATIN (PRAVACHOL) 40 MG TABLET    Take 1 tablet (40 mg total) by mouth daily.   Orders Placed This Encounter  Procedures  . Tdap vaccine greater than or equal to 7yo IM    Signed,  Joslyne Marshburn T. Dasja Brase, MD, [place]   Patient's Medications  New Prescriptions   NONFORMULARY OR COMPOUNDED ITEM    Epic Account: [id number] Lab Studies FLP, HFP: 272.4   PRAVASTATIN (PRAVACHOL) 40 MG TABLET    Take 1 tablet (40 mg total) by mouth daily.  Previous Medications   MULTIPLE VITAMIN (MULTIVITAMIN) CAPSULE    Take 1 capsule by mouth daily.     OMEGA-3 FATTY ACIDS (FISH OIL) 1000 MG CAPS    Take 1 capsule by mouth daily.  Modified Medications   Modified Medication Previous Medication   HYDROCHLOROTHIAZIDE (HYDRODIURIL) 25 MG TABLET hydrochlorothiazide (HYDRODIURIL) 25 MG tablet      Take 1 tablet by mouth  daily    Take 1  tablet by mouth  daily  Discontinued Medications   NONFORMULARY OR COMPOUNDED ITEM    Epic Account: [id number] Lab Studies: Hemoglobin a1c: 250.00   NONFORMULARY OR COMPOUNDED ITEM    Epic Account: [id number] Lab Studies: BMP, CBC with diff, HFP: v58.69 FLP: v77.91, HgbA1c: v77.1 TSH: 780.79 Nicotine and metabolite, Quant: v70.0

## 2014-03-28 NOTE — Telephone Encounter (Signed)
Pt left v/m; pt was seen earlier today for [facility]; pt wanted to verify that nicotine metabolite serum result was available or does pt need to have that blood test drawn.pt needs this test to go on physical form. Pt request cb.

## 2014-03-28 NOTE — Progress Notes (Signed)
Pre visit review using our clinic review tool, if applicable. No additional management support is needed unless otherwise documented below in the visit note. 

## 2014-03-28 NOTE — Telephone Encounter (Signed)
Left message for patient that a nicotine metabolite serum was not drawn.  Will need to have drawn at [facility].  Ask her to return my call and let me know what she needs in order to have that drawn.

## 2014-03-28 NOTE — Telephone Encounter (Signed)
Spoke with Lawson FiscalLori.  She thought the nicotine test was on the original lab order she had gotten from Dr. Patsy Lageropland.  I advised her to check with [facility], that the turn around time for that test may just takes longer and we just don't have her results yet.  If she needs anything else from us to call me back.

## 2014-03-29 ENCOUNTER — Other Ambulatory Visit: Payer: Self-pay | Admitting: Family Medicine

## 2014-03-29 ENCOUNTER — Telehealth: Payer: Self-pay | Admitting: Family Medicine

## 2014-03-29 DIAGNOSIS — Z0000 Encounter for general adult medical examination without abnormal findings: Secondary | ICD-10-CM

## 2014-03-29 LAB — CYTOLOGY - PAP

## 2014-03-29 NOTE — Telephone Encounter (Signed)
Order entered, pt notified

## 2014-03-29 NOTE — Telephone Encounter (Signed)
Pt is calling back in reference to a nicotine metabalite test. She says she did call [facility] as you advised, but they do not have the script.  She needs it today b/c she's leaving tomorrow to go out of town and will be gone for a month.  The results have to be turned in before her return. Thank you.

## 2014-04-02 ENCOUNTER — Encounter: Payer: Self-pay | Admitting: Family Medicine

## 2014-04-02 ENCOUNTER — Encounter: Payer: Self-pay | Admitting: *Deleted

## 2014-04-03 NOTE — Telephone Encounter (Signed)
Can you help pull this [facility] test?

## 2014-04-04 LAB — NICOTINE/COTININE METABOLITES
Cotinine: NOT DETECTED ng/mL
Nicotine: NOT DETECTED ng/mL

## 2014-04-17 ENCOUNTER — Encounter: Payer: Self-pay | Admitting: Family Medicine

## 2014-04-17 ENCOUNTER — Ambulatory Visit: Payer: Self-pay | Admitting: Family Medicine

## 2014-05-29 ENCOUNTER — Other Ambulatory Visit: Payer: Self-pay | Admitting: Family Medicine

## 2014-05-30 MED ORDER — PRAVASTATIN SODIUM 40 MG PO TABS: 40.0000 mg | ORAL_TABLET | Freq: Every day | ORAL | Status: DC

## 2014-09-28 ENCOUNTER — Other Ambulatory Visit: Payer: Self-pay | Admitting: Family Medicine

## 2015-02-17 ENCOUNTER — Other Ambulatory Visit: Payer: Self-pay | Admitting: Family Medicine

## 2015-03-12 ENCOUNTER — Encounter: Payer: Self-pay | Admitting: Family Medicine

## 2015-03-15 MED ORDER — NONFORMULARY OR COMPOUNDED ITEM
Status: DC
Start: 2015-03-15 — End: 2015-04-03

## 2015-03-22 ENCOUNTER — Other Ambulatory Visit: Payer: Self-pay | Admitting: Family Medicine

## 2015-03-23 LAB — CBC WITH DIFFERENTIAL/PLATELET
BASOS ABS: 0 10*3/uL (ref 0.0–0.2)
BASOS: 0 %
EOS (ABSOLUTE): 0.3 10*3/uL (ref 0.0–0.4)
Eos: 3 %
Hematocrit: 43.7 % (ref 34.0–46.6)
Hemoglobin: 15.1 g/dL (ref 11.1–15.9)
IMMATURE GRANS (ABS): 0 10*3/uL (ref 0.0–0.1)
Immature Granulocytes: 0 %
LYMPHS ABS: 3.3 10*3/uL — AB (ref 0.7–3.1)
LYMPHS: 36 %
MCH: 28.2 pg (ref 26.6–33.0)
MCHC: 34.6 g/dL (ref 31.5–35.7)
MCV: 82 fL (ref 79–97)
Monocytes Absolute: 0.6 10*3/uL (ref 0.1–0.9)
Monocytes: 7 %
NEUTROS ABS: 4.9 10*3/uL (ref 1.4–7.0)
Neutrophils: 54 %
PLATELETS: 265 10*3/uL (ref 150–379)
RBC: 5.35 x10E6/uL — ABNORMAL HIGH (ref 3.77–5.28)
RDW: 13.5 % (ref 12.3–15.4)
WBC: 9.1 10*3/uL (ref 3.4–10.8)

## 2015-03-23 LAB — BASIC METABOLIC PANEL
BUN / CREAT RATIO: 16 (ref 9–23)
BUN: 13 mg/dL (ref 6–24)
CHLORIDE: 96 mmol/L — AB (ref 97–108)
CO2: 24 mmol/L (ref 18–29)
CREATININE: 0.79 mg/dL (ref 0.57–1.00)
Calcium: 9.7 mg/dL (ref 8.7–10.2)
GFR calc non Af Amer: 89 mL/min/{1.73_m2} (ref >59)
GFR, EST AFRICAN AMERICAN: 103 mL/min/{1.73_m2} (ref >59)
GLUCOSE: 236 mg/dL — AB (ref 65–99)
Potassium: 4.9 mmol/L (ref 3.5–5.2)
SODIUM: 139 mmol/L (ref 134–144)

## 2015-03-23 LAB — HEPATIC FUNCTION PANEL
ALBUMIN: 4 g/dL (ref 3.5–5.5)
ALT: 29 IU/L (ref 0–32)
AST: 26 IU/L (ref 0–40)
Alkaline Phosphatase: 63 IU/L (ref 39–117)
BILIRUBIN TOTAL: 0.5 mg/dL (ref 0.0–1.2)
BILIRUBIN, DIRECT: 0.12 mg/dL (ref 0.00–0.40)
Total Protein: 6.7 g/dL (ref 6.0–8.5)

## 2015-03-23 LAB — LIPID PANEL W/O CHOL/HDL RATIO
Cholesterol, Total: 208 mg/dL — ABNORMAL HIGH (ref 100–199)
HDL: 46 mg/dL (ref >39)
LDL CALC: 88 mg/dL (ref 0–99)
Triglycerides: 372 mg/dL — ABNORMAL HIGH (ref 0–149)
VLDL CHOLESTEROL CAL: 74 mg/dL — AB (ref 5–40)

## 2015-03-23 LAB — TSH: TSH: 0.903 u[IU]/mL (ref 0.450–4.500)

## 2015-03-23 LAB — NICOTINE METABOLITE, SERUM: NICOTINE: NEGATIVE

## 2015-03-23 LAB — HGB A1C W/O EAG: Hgb A1c MFr Bld: 7.6 % — ABNORMAL HIGH (ref 4.8–5.6)

## 2015-04-03 ENCOUNTER — Encounter: Payer: Self-pay | Admitting: Family Medicine

## 2015-04-03 ENCOUNTER — Ambulatory Visit (INDEPENDENT_AMBULATORY_CARE_PROVIDER_SITE_OTHER): Payer: 59 | Admitting: Family Medicine

## 2015-04-03 VITALS — BP 146/76 | HR 109 | Temp 97.4°F | Ht 67.25 in | Wt 285.5 lb

## 2015-04-03 DIAGNOSIS — E785 Hyperlipidemia, unspecified: Secondary | ICD-10-CM | POA: Diagnosis not present

## 2015-04-03 DIAGNOSIS — Z6841 Body Mass Index (BMI) 40.0 and over, adult: Secondary | ICD-10-CM

## 2015-04-03 DIAGNOSIS — E1165 Type 2 diabetes mellitus with hyperglycemia: Secondary | ICD-10-CM

## 2015-04-03 DIAGNOSIS — Z0000 Encounter for general adult medical examination without abnormal findings: Secondary | ICD-10-CM | POA: Diagnosis not present

## 2015-04-03 DIAGNOSIS — I10 Essential (primary) hypertension: Secondary | ICD-10-CM

## 2015-04-03 DIAGNOSIS — IMO0001 Reserved for inherently not codable concepts without codable children: Secondary | ICD-10-CM

## 2015-04-03 DIAGNOSIS — E119 Type 2 diabetes mellitus without complications: Secondary | ICD-10-CM | POA: Insufficient documentation

## 2015-04-03 HISTORY — DX: Reserved for inherently not codable concepts without codable children: IMO0001

## 2015-04-03 MED ORDER — PRAVASTATIN SODIUM 40 MG PO TABS: ORAL_TABLET | ORAL | Status: DC

## 2015-04-03 MED ORDER — METFORMIN HCL ER 500 MG PO TB24: 500.0000 mg | ORAL_TABLET | Freq: Every day | ORAL | Status: DC

## 2015-04-03 MED ORDER — HYDROCHLOROTHIAZIDE 25 MG PO TABS: ORAL_TABLET | ORAL | Status: DC

## 2015-04-03 MED ORDER — NONFORMULARY OR COMPOUNDED ITEM: Status: DC

## 2015-04-03 NOTE — Progress Notes (Signed)
 Dr. Spencer T. Copland, MD, [facility] [address] [place], [address] Phone: [phone] Fax: [phone]  04/03/2015  Patient: Joyce Rowe, MRN: [id number], DOB: 06/27/1966, 49 y.o.  Primary Physician:  Spencer Copland, MD  Chief Complaint: Annual Exam  Subjective:   Joyce Rowe is a 49 y.o. pleasant patient who presents with the following:  Health Maintenance Summary Reviewed and updated, unless pt declines services.  Tobacco History Reviewed. Non-smoker Alcohol: No concerns, no excessive use Exercise Habits: Some activity, rec at least 30 mins 5 times a week STD concerns: none Drug Use: None Menses regular: yes Lumps or breast concerns: no Breast Cancer Family History: no  Health Maintenance  Topic Date Due  . PNEUMOCOCCAL POLYSACCHARIDE VACCINE (1) 01/04/1970  . FOOT EXAM  01/04/1978  . OPHTHALMOLOGY EXAM  01/04/1978  . URINE MICROALBUMIN  01/04/1978  . HIV Screening  01/05/1983  . INFLUENZA VACCINE  04/02/2016 (Originally 03/11/2015)  . MAMMOGRAM  04/18/2015  . HEMOGLOBIN A1C  09/22/2015  . TETANUS/TDAP  03/28/2024    Immunization History  Administered Date(s) Administered  . Tdap 03/28/2014   Diabetes Mellitus: Tolerating Medications: none, new onset Compliance with diet: fair Exercise: minimal / intermittent Avg blood sugars at home: not checking Foot problems: none Hypoglycemia: none No nausea, vomitting, blurred vision, polyuria.  Lab Results  Component Value Date   HGBA1C 7.6* 03/22/2015   HGBA1C 6.3* 03/23/2014   HGBA1C 6.0* 04/13/2013   Lab Results  Component Value Date   LDLCALC 88 03/22/2015   CREATININE 0.79 03/22/2015    Wt Readings from Last 3 Encounters:  04/03/15 285 lb 8 oz (129.502 kg)  03/28/14 280 lb 12 oz (127.347 kg)  04/12/13 285 lb 8 oz (129.502 kg)    Body mass index is 44.39 kg/(m^2).   HTN: Tolerating all medications without side effects Stable and at goal No  CP, no sob. No HA.  BP Readings from Last 3 Encounters:  04/03/15 146/76  03/28/14 166/82  04/12/13 176/100    Basic Metabolic Panel:    Component Value Date/Time   NA 139 03/22/2015 1036   K 4.9 03/22/2015 1036   CL 96* 03/22/2015 1036   CO2 24 03/22/2015 1036   BUN 13 03/22/2015 1036   CREATININE 0.79 03/22/2015 1036   GLUCOSE 236* 03/22/2015 1036   CALCIUM 9.7 03/22/2015 1036   Lipids: Doing well, stable. Tolerating meds fine with no SE. Panel reviewed with patient.  Lipids:    Component Value Date/Time   CHOL 208* 03/22/2015 1036   TRIG 372* 03/22/2015 1036   HDL 46 03/22/2015 1036    Lab Results  Component Value Date   ALT 29 03/22/2015   AST 26 03/22/2015   ALKPHOS 63 03/22/2015   BILITOT 0.5 03/22/2015     Patient Active Problem List   Diagnosis Date Noted  . Diabetes mellitus type 2, uncontrolled, without complications 04/03/2015  . Other and unspecified hyperlipidemia 03/28/2014  . Morbid obesity with BMI of 40.0-44.9, adult 03/28/2014  . Essential hypertension 10/18/2008   Past Medical History  Diagnosis Date  . Unspecified essential hypertension   . Morbid obesity with BMI of 40.0-44.9, adult 03/28/2014  . Diabetes mellitus type 2, uncontrolled, without complications 04/03/2015   Past Surgical History  Procedure Laterality Date  . Cesarean section  08/2002   Social History   Social History  . Marital Status: Married    Spouse Name: N/A  . Number of Children:   1  . Years of Education: N/A   Occupational History  . BS, SAHM    Social History Main Topics  . Smoking status: Never Smoker   . Smokeless tobacco: Never Used  . Alcohol Use: No  . Drug Use: No  . Sexual Activity: Not on file   Other Topics Concern  . Not on file   Social History Narrative   Married      1 child      BS, SAHM      6-8 hours sleep per night      3 people living in home      Regular exercise-yes   Family History  Problem Relation Age of Onset  .  Colon cancer      Grandparents  <60  . Hypertension Mother   . Hypertension Father   . Heart disease      Grandparents   Allergies  Allergen Reactions  . Metaxalone     REACTION: Rash  . Naproxen     REACTION: Rash    Medication list has been reviewed and updated.   General: Denies fever, chills, sweats. No significant weight loss. Eyes: Denies blurring,significant itching ENT: Denies earache, sore throat, and hoarseness.  Cardiovascular: Denies chest pains, palpitations, dyspnea on exertion,  Respiratory: Denies cough, dyspnea at rest,wheeezing Breast: no concerns about lumps GI: Denies nausea, vomiting, diarrhea, constipation, change in bowel habits, abdominal pain, melena, hematochezia GU: Denies dysuria, hematuria, urinary hesitancy, nocturia, denies STD risk, no concerns about discharge Musculoskeletal: Denies back pain, joint pain Derm: Denies rash, itching Neuro: Denies  paresthesias, frequent falls, frequent headaches Psych: Denies depression, anxiety Endocrine: Denies cold intolerance, heat intolerance, polydipsia Heme: Denies enlarged lymph nodes Allergy: No hayfever  Objective:   BP 146/76 mmHg  Pulse 109  Temp(Src) 97.4 F (36.3 C) (Oral)  Ht 5' 7.25" (1.708 m)  Wt 285 lb 8 oz (129.502 kg)  BMI 44.39 kg/m2  LMP 04/03/2015 No exam data present  GEN: well developed, well nourished, no acute distress Eyes: conjunctiva and lids normal, PERRLA, EOMI ENT: TM clear, nares clear, oral exam WNL Neck: supple, no lymphadenopathy, no thyromegaly, no JVD Pulm: clear to auscultation and percussion, respiratory effort normal CV: regular rate and rhythm, S1-S2, no murmur, rub or gallop, no bruits Chest: no scars, masses, no lumps BREAST: breast exam declined GI: soft, non-tender; no hepatosplenomegaly, masses; active bowel sounds all quadrants GU: GU exam declined Lymph: no cervical, axillary or inguinal adenopathy MSK: gait normal, muscle tone and strength WNL,  no joint swelling, effusions, discoloration, crepitus  SKIN: clear, good turgor, color WNL, no rashes, lesions, or ulcerations Neuro: normal mental status, normal strength, sensation, and motion Psych: alert; oriented to person, place and time, normally interactive and not anxious or depressed in appearance.   All labs reviewed with patient. Lipids:    Component Value Date/Time   CHOL 208* 03/22/2015 1036   TRIG 372* 03/22/2015 1036   HDL 46 03/22/2015 1036   CBC: CBC Latest Ref Rng 03/22/2015 03/23/2014 04/07/2013  WBC 3.4 - 10.8 x10E3/uL 9.1 8.7 8.5  Hemoglobin 11.1 - 15.9 g/dL - 16.2(H) 15.5  Hematocrit 34.0 - 46.6 % 43.7 45.9 44.4  Platelets 150 - 379 x10E3/uL - 309 962    Basic Metabolic Panel:    Component Value Date/Time   NA 139 03/22/2015 1036   K 4.9 03/22/2015 1036   CL 96* 03/22/2015 1036   CO2 24 03/22/2015 1036   BUN 13 03/22/2015 1036   CREATININE  0.79 03/22/2015 1036   GLUCOSE 236* 03/22/2015 1036   CALCIUM 9.7 03/22/2015 1036   Hepatic Function Latest Ref Rng 03/22/2015 03/23/2014 04/07/2013  Total Protein 6.0 - 8.5 g/dL 6.7 7.2 6.7  AST 0 - 40 IU/L 26 29 29  ALT 0 - 32 IU/L 29 31 38(H)  Alk Phosphatase 39 - 117 IU/L 63 66 62  Total Bilirubin 0.0 - 1.2 mg/dL 0.5 0.5 0.5  Bilirubin, Direct 0.00 - 0.40 mg/dL 0.12 0.11 0.11    Lab Results  Component Value Date   TSH 0.903 03/22/2015   No results found.  Assessment and Plan:   Routine general medical examination at a health care facility  Diabetes mellitus type 2, uncontrolled, without complications - Plan: Ambulatory referral to diabetic education >15 minutes spent in face to face time with patient, >50% spent in counselling or coordination of care: on diabetes alone. New-onset diabetes.  Discussed with the patient type 2 diabetes.  Start metformin.  Given prescription for meter, test strips, lancets.  Check blood sugar fasting daily and then 2 hours after a meal.  Referred to diabetic  education.  Morbid obesity with BMI of 40.0-44.9, adult  Essential hypertension: stable  Hyperlipidemia LDL goal <70, stable on pravastatin   Health Maintenance Exam: The patient's preventative maintenance and recommended screening tests for an annual wellness exam were reviewed in full today. Brought up to date unless services declined.  Counselled on the importance of diet, exercise, and its role in overall health and mortality. The patient's FH and SH was reviewed, including their home life, tobacco status, and drug and alcohol status.  Follow-up: 1-2 mo Or follow-up in 1 year for complete physical examination  New Prescriptions   METFORMIN (GLUCOPHAGE-XR) 500 MG 24 HR TABLET    Take 1 tablet (500 mg total) by mouth daily with breakfast.   NONFORMULARY OR COMPOUNDED ITEM    Epic Account: [id number] A1c, E11.9 Urine microalbumin, E11.9 BMET: Z79.899   Orders Placed This Encounter  Procedures  . Ambulatory referral to diabetic education    Signed,  Spencer T. Copland, MD   Patient's Medications  New Prescriptions   METFORMIN (GLUCOPHAGE-XR) 500 MG 24 HR TABLET    Take 1 tablet (500 mg total) by mouth daily with breakfast.   NONFORMULARY OR COMPOUNDED ITEM    Epic Account: [id number] A1c, E11.9 Urine microalbumin, E11.9 BMET: Z79.899  Previous Medications   MULTIPLE VITAMIN (MULTIVITAMIN) CAPSULE    Take 1 capsule by mouth daily.     OMEGA-3 FATTY ACIDS (FISH OIL) 1000 MG CAPS    Take 1 capsule by mouth daily.  Modified Medications   Modified Medication Previous Medication   HYDROCHLOROTHIAZIDE (HYDRODIURIL) 25 MG TABLET hydrochlorothiazide (HYDRODIURIL) 25 MG tablet      Take 1 tablet by mouth  daily    Take 1 tablet by mouth  daily   PRAVASTATIN (PRAVACHOL) 40 MG TABLET pravastatin (PRAVACHOL) 40 MG tablet      Take 1 tablet by mouth  daily    Take 1 tablet by mouth  daily  Discontinued Medications   NONFORMULARY OR COMPOUNDED ITEM    Epic Account: [id number] Lab  Studies FLP, HFP: 272.4   NONFORMULARY OR COMPOUNDED ITEM    Epic Account: [id number] Lab Studies: BMP, CBC with diff, HFP, TSH: Z79.899 FLP: E78.5 Hemoglobin a1c: z79.899 Cotinine (Nicotine metabolites): z00.00       

## 2015-04-03 NOTE — Progress Notes (Signed)
Pre visit review using our clinic review tool, if applicable. No additional management support is needed unless otherwise documented below in the visit note. 

## 2015-04-03 NOTE — Patient Instructions (Signed)

## 2015-04-09 ENCOUNTER — Other Ambulatory Visit: Payer: Self-pay | Admitting: Family Medicine

## 2015-04-09 DIAGNOSIS — Z1231 Encounter for screening mammogram for malignant neoplasm of breast: Secondary | ICD-10-CM

## 2015-04-12 ENCOUNTER — Encounter: Payer: Self-pay | Admitting: *Deleted

## 2015-04-12 ENCOUNTER — Encounter: Payer: 59 | Attending: Family Medicine | Admitting: *Deleted

## 2015-04-12 VITALS — BP 164/96 | Ht 68.0 in | Wt 284.0 lb

## 2015-04-12 DIAGNOSIS — E119 Type 2 diabetes mellitus without complications: Secondary | ICD-10-CM | POA: Diagnosis not present

## 2015-04-12 NOTE — Patient Instructions (Addendum)
Check blood sugars 2 x day before breakfast and 2 hrs after a different meal every day Exercise: Continue walking/swimming for 30  minutes 4-5 days a week and increase to 150 minutes/week Eat 3 meals day,  1-2  snacks a day Space meals 4-6 hours apart Complete 3 Day Food Record and bring to next appt Make a eye doctor appointment Bring blood sugar records to the next appointment Return for appointment on:  Friday Sept 23, 2016 at 9:00 am with [facility] (dietitian)

## 2015-04-12 NOTE — Progress Notes (Signed)
Diabetes Self-Management Education  Visit Type: First/Initial  Appt. Start Time: 0905 Appt. End Time: 1020  04/12/2015  [name], identified by name and date of birth, is a 49 y.o. female with a diagnosis of Diabetes: Type 2.   ASSESSMENT  Blood pressure 164/96, height  (1.727 m), Pt had not taken BP medication today and reported nervousness Weight 284 lb (128.822 kg), last menstrual period 04/03/2015. Body mass index is 43.19 kg/(m^2).      Diabetes Self-Management Education - 04/12/15 1052    Visit Information   Visit Type First/Initial   Initial Visit   Diabetes Type Type 2   Are you currently following a meal plan? No   Are you taking your medications as prescribed? Yes   Date Diagnosed 1 week ago   Health Coping   How would you rate your overall health? Good   Psychosocial Assessment   Patient Belief/Attitude about Diabetes Motivated to manage diabetes  "not too good"  She was teary-eyed during visit regarding diagnosis.    Self-care barriers None   Self-management support Family;Doctor's office   Patient Concerns Nutrition/Meal planning;Medication;Monitoring;Healthy Lifestyle;Glycemic Control;Weight Control   Special Needs None   Preferred Learning Style Visual   Learning Readiness Change in progress   How often do you need to have someone help you when you read instructions, pamphlets, or other written materials from your doctor or pharmacy? 1 - Never   What is the last grade level you completed in school? college   Complications   Last HgB A1C per patient/outside source 7.6 %  03/22/15   How often do you check your blood sugar? 1-2 times/day   Fasting Blood glucose range (mg/dL) 161-096  FBG's 045-409 mg/dL with reading of 811 mg/dL today   Postprandial Blood glucose range (mg/dL) 914-782;956-213;>086  pp's 130-218 mg/dL - 578 reading last night after eating spaghetti with meatballs   Have you had a dilated eye exam in the past 12 months? No   Have you  had a dental exam in the past 12 months? Yes   Are you checking your feet? No   Dietary Intake   Breakfast cereal and milk; oatmeal   Snack (morning) crackers, fruit, popcorn   Lunch [place] and cheese, peanut butter and jelly sandwich   Snack (afternoon) yogurt, toast   Dinner chicken, vegetables, starches   Snack (evening) sometimes cereal and milk   Beverage(s) water, Crystal Lite   Exercise   Exercise Type Moderate (swimming / aerobic walking)   How many days per week to you exercise? 4   How many minutes per day do you exercise? 30   Total minutes per week of exercise 120   Patient Education   Previous Diabetes Education No   Disease state  Definition of diabetes, type 1 and 2, and the diagnosis of diabetes;Factors that contribute to the development of diabetes   Nutrition management  Role of diet in the treatment of diabetes and the relationship between the three main macronutrients and blood glucose level   Physical activity and exercise  Role of exercise on diabetes management, blood pressure control and cardiac health.   Medications Reviewed patients medication for diabetes, action, purpose, timing of dose and side effects.   Monitoring Purpose and frequency of SMBG.;Identified appropriate SMBG and/or A1C goals.   Chronic complications Relationship between chronic complications and blood glucose control;Assessed and discussed foot care and prevention of foot problems;Retinopathy and reason for yearly dilated eye exams   Psychosocial adjustment Identified  and addressed patients feelings and concerns about diabetes   Individualized Goals (developed by patient)   Reducing Risk Improve blood sugars Decrease medications Lose weight Lead a healthier lifestyle Become more fit   Outcomes   Expected Outcomes Demonstrated interest in learning. Expect positive outcomes      Individualized Plan for Diabetes Self-Management Training:   Learning Objective:  Patient will have a greater  understanding of diabetes self-management. Patient education plan is to attend individual and/or group sessions per assessed needs and concerns.   Plan:   Patient Instructions  Check blood sugars 2 x day before breakfast and 2 hrs after a different meal every day Exercise: Continue walking/swimming for 30  minutes 4-5 days a week and increase to 150 minutes/week Eat 3 meals day,  1-2  snacks a day Space meals 4-6 hours apart Complete 3 Day Food Record and bring to next appt Make a eye doctor appointment Bring blood sugar records to the next appointment Return for appointment on:  Friday Sept 23, 2016 at 9:00 am with [facility] (dietitian)  Expected Outcomes:  Demonstrated interest in learning. Expect positive outcomes  Education material provided:  General Meal Planning Guidelines 3 Day Food Record  If problems or questions, patient to contact team via:   Sharion Settler, RN, CCM, CDE [phone]  Future DSME appointment:  Friday Sept 23, 2016 at 9:00 am with dietitian

## 2015-04-19 ENCOUNTER — Ambulatory Visit
Admission: RE | Admit: 2015-04-19 | Discharge: 2015-04-19 | Disposition: A | Discharge status: Home / Self Care | Payer: 59 | Source: Ambulatory Visit | Attending: Family Medicine | Admitting: Family Medicine

## 2015-04-19 DIAGNOSIS — Z1231 Encounter for screening mammogram for malignant neoplasm of breast: Secondary | ICD-10-CM

## 2015-05-03 ENCOUNTER — Encounter: Payer: 59 | Admitting: Dietician

## 2015-05-03 VITALS — BP 162/98 | Ht 68.0 in | Wt 279.9 lb

## 2015-05-03 DIAGNOSIS — E119 Type 2 diabetes mellitus without complications: Secondary | ICD-10-CM | POA: Diagnosis not present

## 2015-05-03 NOTE — Progress Notes (Signed)
BP elevated; advised patient to recheck at home, as she states ehr BP tends to increase when coming to office visits. She also reports some increased stress, and more frequent restaurant meals in past few weeks due to kitchen remodeling.

## 2015-05-03 NOTE — Progress Notes (Signed)
Diabetes Self-Management Education  Visit Type:  Follow-up  Appt. Start Time: 0900 Appt. End Time: 1000  05/03/2015  [name], identified by name and date of birth, is a 49 y.o. female with a diagnosis of Diabetes: Type 2.   ASSESSMENT  Height  (1.727 m), weight 279 lb 14.4 oz (126.962 kg), last menstrual period 04/03/2015. Body mass index is 42.57 kg/(m^2).       Diabetes Self-Management Education - 05/03/15 0907    Complications   Fasting Blood glucose range (mg/dL) 161-096   Postprandial Blood glucose range (mg/dL) 045-409   Have you had a dilated eye exam in the past 12 months? Yes  04/2015   Have you had a dental exam in the past 12 months? Yes   Are you checking your feet? Yes   How many days per week are you checking your feet? 7   Exercise   Exercise Type ADL's  no recent exercise due to home remodeling; plans to resume walking 4-5 days per week   Patient Education   Nutrition management  Food label reading, portion sizes and measuring food.;Role of diet in the treatment of diabetes and the relationship between the three main macronutrients and blood glucose level;Meal options for control of blood glucose level and chronic complications.;Other (comment)  provided 1600kcal meal plan for weight loss   Monitoring Taught/discussed recording of test results and interpretation of SMBG.;Daily foot exams   Psychosocial adjustment Role of stress on diabetes;Other (comment)  importance of adequate sleep in controlling BGs   Outcomes   Program Status Completed      Learning Objective:  Patient will have a greater understanding of diabetes self-management. Patient education plan is to attend individual and/or group sessions per assessed needs and concerns.   Plan:   Patient Instructions   Try healthydiningfinder.com for healthy options when you eat out.   Try calorieking.com to look up nutrition information for foods when you don't have a label.  OK to add some  fruits with meals or snacks as long as you keep to 45g each meal.  Continue making great, healthy choices!    Expected Outcomes:  Demonstrated interest in learning. Expect positive outcomes  Education material provided: Planning A Balanced Meal with food lists and 1600kcal meal plan         Quick and Healthy Meal Ideas         Smart Snacking  If problems or questions, patient to contact team via:  Phone  Future DSME appointment: - PRN

## 2015-05-03 NOTE — Patient Instructions (Signed)
   Try healthydiningfinder.com for healthy options when you eat out.   Try calorieking.com to look up nutrition information for foods when you don't have a label.  OK to add some fruits with meals or snacks as long as you keep to 45g each meal.  Continue making great, healthy choices!

## 2015-05-06 ENCOUNTER — Other Ambulatory Visit: Payer: Self-pay | Admitting: Family Medicine

## 2015-05-07 LAB — BASIC METABOLIC PANEL
BUN / CREAT RATIO: 19 (ref 9–23)
BUN: 15 mg/dL (ref 6–24)
CO2: 24 mmol/L (ref 18–29)
CREATININE: 0.79 mg/dL (ref 0.57–1.00)
Calcium: 9.4 mg/dL (ref 8.7–10.2)
Chloride: 100 mmol/L (ref 97–108)
GFR, EST AFRICAN AMERICAN: 103 mL/min/{1.73_m2} (ref >59)
GFR, EST NON AFRICAN AMERICAN: 89 mL/min/{1.73_m2} (ref >59)
GLUCOSE: 170 mg/dL — AB (ref 65–99)
Potassium: 4.3 mmol/L (ref 3.5–5.2)
SODIUM: 141 mmol/L (ref 134–144)

## 2015-05-07 LAB — HGB A1C W/O EAG: Hgb A1c MFr Bld: 7.1 % — ABNORMAL HIGH (ref 4.8–5.6)

## 2015-05-07 LAB — MICROALBUMIN, URINE: Microalbumin, Urine: 61.7 ug/mL

## 2015-05-13 ENCOUNTER — Encounter: Payer: Self-pay | Admitting: Family Medicine

## 2015-05-13 ENCOUNTER — Ambulatory Visit (INDEPENDENT_AMBULATORY_CARE_PROVIDER_SITE_OTHER): Payer: 59 | Admitting: Family Medicine

## 2015-05-13 VITALS — BP 158/88 | HR 103 | Temp 98.1°F | Ht 67.25 in | Wt 282.0 lb

## 2015-05-13 DIAGNOSIS — IMO0001 Reserved for inherently not codable concepts without codable children: Secondary | ICD-10-CM

## 2015-05-13 DIAGNOSIS — Z23 Encounter for immunization: Secondary | ICD-10-CM | POA: Diagnosis not present

## 2015-05-13 DIAGNOSIS — I10 Essential (primary) hypertension: Secondary | ICD-10-CM

## 2015-05-13 DIAGNOSIS — E1165 Type 2 diabetes mellitus with hyperglycemia: Secondary | ICD-10-CM

## 2015-05-13 MED ORDER — LISINOPRIL 20 MG PO TABS
20.0000 mg | ORAL_TABLET | Freq: Every day | ORAL | Status: DC
Start: 2015-05-13 — End: 2016-03-15

## 2015-05-13 MED ORDER — METFORMIN HCL ER 500 MG PO TB24: 500.0000 mg | ORAL_TABLET | Freq: Every day | ORAL | Status: DC

## 2015-05-13 MED ORDER — HYDROCHLOROTHIAZIDE 25 MG PO TABS: ORAL_TABLET | ORAL | Status: DC

## 2015-05-13 MED ORDER — ONETOUCH ULTRA BLUE VI STRP: ORAL_STRIP | Status: DC

## 2015-05-13 NOTE — Progress Notes (Signed)
Dr. Frederico Hamman T. Aubrina Nieman, MD, [facility] and Sports Medicine [place], [address] Phone: [phone] Fax: [phone]  05/13/2015  Patient: Joyce Rowe, MRN: [id number], DOB: Mar 30, 1966, 49 y.o.  Primary Physician:  Owens Loffler, MD  Chief Complaint: Follow-up  Subjective:   Joyce Rowe is a 48 y.o. very pleasant female patient who presents with the following:  Diabetes Mellitus: Tolerating Medications: yes Compliance with diet: fair/ok Exercise: minimal / intermittent Avg blood sugars at home: 120-200 Foot problems: none Hypoglycemia: none No nausea, vomitting, blurred vision, polyuria.  Lab Results  Component Value Date   HGBA1C 7.1* 05/06/2015   HGBA1C 7.6* 03/22/2015   HGBA1C 6.3* 03/23/2014   Lab Results  Component Value Date   LDLCALC 88 03/22/2015   CREATININE 0.79 05/06/2015    Wt Readings from Last 3 Encounters:  05/13/15 282 lb (127.914 kg)  05/03/15 279 lb 14.4 oz (126.962 kg)  04/12/15 284 lb (128.822 kg)    Body mass index is 43.85 kg/(m^2).   HTN: Tolerating all medications without side effects Not at goal No CP, no sob. No HA.  BP Readings from Last 3 Encounters:  05/13/15 158/88  05/03/15 162/98  04/12/15 389/37    Basic Metabolic Panel:    Component Value Date/Time   NA 141 05/06/2015 0812   K 4.3 05/06/2015 0812   CL 100 05/06/2015 0812   CO2 24 05/06/2015 0812   BUN 15 05/06/2015 0812   CREATININE 0.79 05/06/2015 0812   GLUCOSE 170* 05/06/2015 0812   CALCIUM 9.4 05/06/2015 3428      Past Medical History, Surgical History, Social History, Family History, Problem List, Medications, and Allergies have been reviewed and updated if relevant.  Patient Active Problem List   Diagnosis Date Noted  . Diabetes mellitus type 2, uncontrolled, without complications ([facility]) 76/81/1572  . Hyperlipidemia LDL goal <70 03/28/2014  . Morbid obesity with BMI of 40.0-44.9, adult ([facility]) 03/28/2014  .  Essential hypertension 10/18/2008    Past Medical History  Diagnosis Date  . Unspecified essential hypertension   . Morbid obesity with BMI of 40.0-44.9, adult ([facility]) 03/28/2014  . Diabetes mellitus type 2, uncontrolled, without complications ([facility]) 01/27/3558  . Diabetes mellitus without complication ([facility])   . Hyperlipidemia     Past Surgical History  Procedure Laterality Date  . Cesarean section  08/2002    Social History   Social History  . Marital Status: Married    Spouse Name: N/A  . Number of Children: 1  . Years of Education: N/A   Occupational History  . BS, SAHM    Social History Main Topics  . Smoking status: Never Smoker   . Smokeless tobacco: Never Used  . Alcohol Use: No  . Drug Use: No  . Sexual Activity: Not on file   Other Topics Concern  . Not on file   Social History Narrative   Married      1 child      BS, SAHM      6-8 hours sleep per night      3 people living in home      Regular exercise-yes    Family History  Problem Relation Age of Onset  . Colon cancer      Grandparents  <60  . Heart disease      Grandparents  . Hypertension Mother   . Hypertension Father   . Diabetes Maternal Grandmother   . Breast cancer Neg Hx  Allergies  Allergen Reactions  . Metaxalone     REACTION: Rash  . Naproxen     REACTION: Rash  . Bextra [Valdecoxib] Rash    Medication list reviewed and updated in full in [facility].   GEN: No acute illnesses, no fevers, chills. GI: No n/v/d, eating normally Pulm: No SOB Interactive and getting along well at home.  Otherwise, ROS is as per the HPI.  Objective:   BP 158/88 mmHg  Pulse 103  Temp(Src) 98.1 F (36.7 C) (Oral)  Ht 5' 7.25" (1.708 m)  Wt 282 lb (127.914 kg)  BMI 43.85 kg/m2  LMP 05/04/2015  GEN: WDWN, NAD, Non-toxic, A & O x 3 HEENT: Atraumatic, Normocephalic. Neck supple. No masses, No LAD. Ears and Nose: No external deformity. CV: RRR, No M/G/R. No JVD. No thrill.  No extra heart sounds. PULM: CTA B, no wheezes, crackles, rhonchi. No retractions. No resp. distress. No accessory muscle use. EXTR: No c/c/e NEURO Normal gait.  PSYCH: Normally interactive. Conversant. Not depressed or anxious appearing.  Calm demeanor.   Laboratory and Imaging Data: Results for orders placed or performed in visit on 43/32/95  Basic metabolic panel  Result Value Ref Range   Glucose 170 (H) 65 - 99 mg/dL   BUN 15 6 - 24 mg/dL   Creatinine, Ser 0.79 0.57 - 1.00 mg/dL   GFR calc non Af Amer 89 >59 mL/min/1.73   GFR calc Af Amer 103 >59 mL/min/1.73   BUN/Creatinine Ratio 19 9 - 23   Sodium 141 134 - 144 mmol/L   Potassium 4.3 3.5 - 5.2 mmol/L   Chloride 100 97 - 108 mmol/L   CO2 24 18 - 29 mmol/L   Calcium 9.4 8.7 - 10.2 mg/dL  Hgb A1c w/o eAG  Result Value Ref Range   Hgb A1c MFr Bld 7.1 (H) 4.8 - 5.6 %  Microalbumin, urine  Result Value Ref Range   Microalbum.,U,Random 61.7 Not Estab. ug/mL     Assessment and Plan:   Essential hypertension  Need for prophylactic vaccination and inoculation against influenza - Plan: Flu Vaccine QUAD 36+ mos IM  Need for prophylactic vaccination against Streptococcus pneumoniae (pneumococcus) - Plan: Pneumococcal polysaccharide vaccine 23-valent greater than or equal to 2yo subcutaneous/IM  Uncontrolled type 2 diabetes mellitus without complication, without long-term current use of insulin (HCC)  HTN, unstable, start ACE  Flu and PNA vacc  No change in DM meds  Follow-up: Return in about 3 months (around 08/13/2015).  New Prescriptions   LISINOPRIL (PRINIVIL,ZESTRIL) 20 MG TABLET    Take 1 tablet (20 mg total) by mouth daily.   Orders Placed This Encounter  Procedures  . Pneumococcal polysaccharide vaccine 23-valent greater than or equal to 2yo subcutaneous/IM  . Flu Vaccine QUAD 36+ mos IM    Signed,  Nawaf Strange T. Cigi Bega, MD   Patient's Medications  New Prescriptions   LISINOPRIL (PRINIVIL,ZESTRIL) 20 MG  TABLET    Take 1 tablet (20 mg total) by mouth daily.  Previous Medications   BLOOD GLUCOSE MONITORING SUPPL (ONE TOUCH ULTRA MINI) W/DEVICE KIT    CHECK BLOOD SUGAR TWICE A DAY AS DIRECTED   MULTIPLE VITAMIN (MULTIVITAMIN) CAPSULE    Take 1 capsule by mouth daily.     OMEGA-3 FATTY ACIDS (FISH OIL) 1000 MG CAPS    Take 1 capsule by mouth daily.   ONETOUCH DELICA LANCETS FINE MISC    CHECK BLOOD SUGAR TWICE A DAY AS DIRECTED   PRAVASTATIN (PRAVACHOL) 40 MG  TABLET    Take 1 tablet by mouth  daily  Modified Medications   Modified Medication Previous Medication   HYDROCHLOROTHIAZIDE (HYDRODIURIL) 25 MG TABLET hydrochlorothiazide (HYDRODIURIL) 25 MG tablet      Take 1 tablet by mouth  daily    Take 1 tablet by mouth  daily   METFORMIN (GLUCOPHAGE-XR) 500 MG 24 HR TABLET metFORMIN (GLUCOPHAGE-XR) 500 MG 24 hr tablet      Take 1 tablet (500 mg total) by mouth daily with breakfast.    Take 1 tablet (500 mg total) by mouth daily with breakfast.   ONE TOUCH ULTRA TEST TEST STRIP ONE TOUCH ULTRA TEST test strip      Use to check blood sugar two times a day.  Dx:  E11.65      Discontinued Medications   NONFORMULARY OR COMPOUNDED ITEM    Epic Account: [id number] A1c, E11.9 Urine microalbumin, E11.9 BMET: J03.009

## 2015-05-13 NOTE — Progress Notes (Signed)
Pre visit review using our clinic review tool, if applicable. No additional management support is needed unless otherwise documented below in the visit note. 

## 2015-08-14 ENCOUNTER — Other Ambulatory Visit: Payer: Self-pay | Admitting: Family Medicine

## 2015-08-14 ENCOUNTER — Encounter: Payer: Self-pay | Admitting: Family Medicine

## 2015-08-14 DIAGNOSIS — E1165 Type 2 diabetes mellitus with hyperglycemia: Principal | ICD-10-CM

## 2015-08-14 DIAGNOSIS — IMO0001 Reserved for inherently not codable concepts without codable children: Secondary | ICD-10-CM

## 2015-08-14 NOTE — Telephone Encounter (Signed)
Can you help set up for [facility] draw site?  A1c, E11.9

## 2015-08-15 LAB — HEMOGLOBIN A1C
ESTIMATED AVERAGE GLUCOSE: 134 mg/dL
Hgb A1c MFr Bld: 6.3 % — ABNORMAL HIGH (ref 4.8–5.6)

## 2015-08-19 ENCOUNTER — Ambulatory Visit: Payer: Self-pay | Admitting: Family Medicine

## 2015-08-21 ENCOUNTER — Encounter: Payer: Self-pay | Admitting: Family Medicine

## 2015-08-21 ENCOUNTER — Ambulatory Visit (INDEPENDENT_AMBULATORY_CARE_PROVIDER_SITE_OTHER): Payer: 59 | Admitting: Family Medicine

## 2015-08-21 VITALS — BP 164/80 | HR 116 | Temp 98.3°F | Ht 67.25 in | Wt 274.8 lb

## 2015-08-21 DIAGNOSIS — I10 Essential (primary) hypertension: Secondary | ICD-10-CM | POA: Insufficient documentation

## 2015-08-21 DIAGNOSIS — E1165 Type 2 diabetes mellitus with hyperglycemia: Secondary | ICD-10-CM | POA: Diagnosis not present

## 2015-08-21 DIAGNOSIS — IMO0001 Reserved for inherently not codable concepts without codable children: Secondary | ICD-10-CM

## 2015-08-21 NOTE — Progress Notes (Signed)
Pre visit review using our clinic review tool, if applicable. No additional management support is needed unless otherwise documented below in the visit note. 

## 2015-08-21 NOTE — Progress Notes (Signed)
Dr. Frederico Hamman T. Maxxwell Edgett, MD, [facility] Primary Care and Sports Medicine [place], [address] Phone: [phone] Fax: [phone]  08/21/2015  Patient: Joyce Rowe, MRN: [id number], DOB: 1966/01/07, 50 y.o.  Primary Physician:  Owens Loffler, MD   Chief Complaint  Patient presents with  . Diabetes   Subjective:   Joyce Rowe is a 50 y.o. very pleasant female patient who presents with the following:  Lab Results  Component Value Date   HGBA1C 6.3* 08/15/2015    Wt Readings from Last 3 Encounters:  08/21/15 274 lb 12 oz (124.626 kg)  05/13/15 282 lb (127.914 kg)  05/03/15 279 lb 14.4 oz (126.962 kg)    Joined a gym in December  BP Readings from Last 3 Encounters:  08/21/15 164/80  05/13/15 158/88  05/03/15 162/98    120/77 at home.  Diabetes Mellitus: Tolerating Medications: yes Compliance with diet: good Exercise: minimal / intermittent Avg blood sugars at home: 110-180 fasting Foot problems: none Hypoglycemia: none No nausea, vomitting, blurred vision, polyuria.  Lab Results  Component Value Date   HGBA1C 6.3* 08/15/2015   HGBA1C 7.1* 05/06/2015   HGBA1C 7.6* 03/22/2015   Lab Results  Component Value Date   LDLCALC 88 03/22/2015   CREATININE 0.79 05/06/2015    Wt Readings from Last 3 Encounters:  08/21/15 274 lb 12 oz (124.626 kg)  05/13/15 282 lb (127.914 kg)  05/03/15 279 lb 14.4 oz (126.962 kg)    Body mass index is 42.72 kg/(m^2).   HTN: Tolerating all medications without side effects Stable and at goal No CP, no sob. No HA.  BP Readings from Last 3 Encounters:  08/21/15 164/80  05/13/15 158/88  05/03/15 903/00    Basic Metabolic Panel:    Component Value Date/Time   NA 141 05/06/2015 0812   K 4.3 05/06/2015 0812   CL 100 05/06/2015 0812   CO2 24 05/06/2015 0812   BUN 15 05/06/2015 0812   CREATININE 0.79 05/06/2015 0812   GLUCOSE 170* 05/06/2015 0812   CALCIUM 9.4 05/06/2015 9233      Past  Medical History, Surgical History, Social History, Family History, Problem List, Medications, and Allergies have been reviewed and updated if relevant.  Patient Active Problem List   Diagnosis Date Noted  . Diabetes mellitus type 2, uncontrolled, without complications ([facility]) 00/76/2263  . Hyperlipidemia LDL goal <70 03/28/2014  . Morbid obesity with BMI of 40.0-44.9, adult ([facility]) 03/28/2014  . Essential hypertension 10/18/2008    Past Medical History  Diagnosis Date  . Unspecified essential hypertension   . Morbid obesity with BMI of 40.0-44.9, adult ([facility]) 03/28/2014  . Diabetes mellitus type 2, uncontrolled, without complications ([facility]) 3/35/4562  . Diabetes mellitus without complication ([facility])   . Hyperlipidemia     Past Surgical History  Procedure Laterality Date  . Cesarean section  08/2002    Social History   Social History  . Marital Status: Married    Spouse Name: N/A  . Number of Children: 1  . Years of Education: N/A   Occupational History  . BS, SAHM    Social History Main Topics  . Smoking status: Never Smoker   . Smokeless tobacco: Never Used  . Alcohol Use: No  . Drug Use: No  . Sexual Activity: Not on file   Other Topics Concern  . 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 Colon cancer      Grandparents  <60  . Heart disease      Grandparents  . Hypertension Mother   . Hypertension Father   . Diabetes Maternal Grandmother   . Breast cancer Neg Hx     Allergies  Allergen Reactions  . Metaxalone     REACTION: Rash  . Naproxen     REACTION: Rash  . Bextra [Valdecoxib] Rash    Medication list reviewed and updated in full in [facility].   GEN: No acute illnesses, no fevers, chills. GI: No n/v/d, eating normally Pulm: No SOB Interactive and getting along  well at home.  Otherwise, ROS is as per the HPI.  Objective:   BP 164/80 mmHg  Pulse 116  Temp(Src) 98.3 F (36.8 C) (Oral)  Ht 5' 7.25" (1.708 m)  Wt 274 lb 12 oz (124.626 kg)  BMI 42.72 kg/m2  LMP 07/29/2015 (Approximate)  GEN: WDWN, NAD, Non-toxic, A & O x 3 HEENT: Atraumatic, Normocephalic. Neck supple. No masses, No LAD. Ears and Nose: No external deformity. CV: RRR, No M/G/R. No JVD. No thrill. No extra heart sounds. PULM: CTA B, no wheezes, crackles, rhonchi. No retractions. No resp. distress. No accessory muscle use. EXTR: No c/c/e NEURO Normal gait.  PSYCH: Normally interactive. Conversant. Not depressed or anxious appearing.  Calm demeanor.   Laboratory and Imaging Data:  Assessment and Plan:   Uncontrolled type 2 diabetes mellitus without complication, without long-term current use of insulin (HCC)  Essential hypertension  BS stabilized Home BP readings have improved - extreme anxiety in the office  Follow-up: Return in about 7 months (around 03/20/2016).  Signed,  [name]. Jalaiya Oyster, MD   Patient's Medications  New Prescriptions   No medications on file  Previous Medications   BLOOD GLUCOSE MONITORING SUPPL (ONE TOUCH ULTRA MINI) W/DEVICE KIT    CHECK BLOOD SUGAR TWICE A DAY AS DIRECTED   HYDROCHLOROTHIAZIDE (HYDRODIURIL) 25 MG TABLET    Take 1 tablet by mouth  daily   LISINOPRIL (PRINIVIL,ZESTRIL) 20 MG TABLET    Take 1 tablet (20 mg total) by mouth daily.   METFORMIN (GLUCOPHAGE-XR) 500 MG 24 HR TABLET    Take 1 tablet (500 mg total) by mouth daily with breakfast.   MULTIPLE VITAMIN (MULTIVITAMIN) CAPSULE    Take 1 capsule by mouth daily.     OMEGA-3 FATTY ACIDS (FISH OIL) 1000 MG CAPS    Take 1 capsule by mouth daily.   ONE TOUCH ULTRA TEST TEST STRIP    Use to check blood sugar two times a day.  Dx:  X48.01   ONETOUCH DELICA LANCETS FINE MISC    CHECK BLOOD SUGAR TWICE A DAY AS DIRECTED   PRAVASTATIN (PRAVACHOL) 40 MG TABLET    Take 1 tablet by  mouth  daily  Modified Medications   No medications on file  Discontinued Medications   No medications on file

## 2015-10-23 ENCOUNTER — Ambulatory Visit
Admission: EM | Admit: 2015-10-23 | Discharge: 2015-10-23 | Disposition: A | Discharge status: Home / Self Care | Payer: 59 | Attending: Family Medicine | Admitting: Family Medicine

## 2015-10-23 ENCOUNTER — Telehealth: Payer: 59 | Admitting: Nurse Practitioner

## 2015-10-23 DIAGNOSIS — N390 Urinary tract infection, site not specified: Secondary | ICD-10-CM | POA: Diagnosis not present

## 2015-10-23 DIAGNOSIS — N3000 Acute cystitis without hematuria: Secondary | ICD-10-CM

## 2015-10-23 LAB — URINALYSIS COMPLETE WITH MICROSCOPIC (ARMC ONLY)
Bilirubin Urine: NEGATIVE
Glucose, UA: NEGATIVE mg/dL
Ketones, ur: NEGATIVE mg/dL
Leukocytes, UA: NEGATIVE
Nitrite: NEGATIVE
Protein, ur: NEGATIVE mg/dL
Specific Gravity, Urine: 1.025 (ref 1.005–1.030)
pH: 5.5 (ref 5.0–8.0)

## 2015-10-23 LAB — PREGNANCY, URINE: Preg Test, Ur: NEGATIVE

## 2015-10-23 MED ORDER — CIPROFLOXACIN HCL 500 MG PO TABS: 500.0000 mg | ORAL_TABLET | Freq: Two times a day (BID) | ORAL | Status: DC

## 2015-10-23 NOTE — Progress Notes (Signed)
Based on what you shared with me it looks like you have a serious condition that should be evaluated in a face to face office visit. Need to have urine culture done.   If you are having a true medical emergency please call 911.  If you need an urgent face to face visit, [facility] has four urgent care centers for your convenience.  Tressie Ellis. [facility]  [phone] Get Driving Directions Find a Provider at this Location  [address] [place], [place] [address] . 8 am to 8 pm Monday-Friday . 9 am to 7 pm Saturday-Sunday  . [facility]  [phone] Get Driving Directions Find a Provider at this Location  [address] [place], [place] [address] . 8 am to 8 pm Monday-Friday . 9 am to 6 pm Saturday . 11 am to 6 pm Sunday   . [facility] [place]  [phone] Get Driving Directions  [address] [place], [place] [address] . 8 am to 8 pm Monday-Friday . 9 am to 4 pm Saturday-Sunday   . [facility] (a walk in primary care provider)  [phone]  Get Driving Directions Find a Provider at this Location  [address] [place], [place] [address] . 8 am to 8:30 pm Monday-Thursday . 8 am to 6 pm Friday . 8 am to 4 pm Saturday-Sunday   Your e-visit answers were reviewed by a board certified advanced clinical practitioner to complete your personal care plan.  Thank you for using e-Visits.

## 2015-10-23 NOTE — Discharge Instructions (Signed)
Take medication as prescribed. Rest. Drink plenty of fluids.   Follow up with your primary care physician in one week as discussed or sooner as needed.   Return to Urgent care or proceed to ER for abdominal pain, fever, new or worsening concerns.    Urinary Tract Infection Urinary tract infections (UTIs) can develop anywhere along your urinary tract. Your urinary tract is your body's drainage system for removing wastes and extra water. Your urinary tract includes two kidneys, two ureters, a bladder, and a urethra. Your kidneys are a pair of bean-shaped organs. Each kidney is about the size of your fist. They are located below your ribs, one on each side of your spine. CAUSES Infections are caused by microbes, which are microscopic organisms, including fungi, viruses, and bacteria. These organisms are so small that they can only be seen through a microscope. Bacteria are the microbes that most commonly cause UTIs. SYMPTOMS  Symptoms of UTIs may vary by age and gender of the patient and by the location of the infection. Symptoms in young women typically include a frequent and intense urge to urinate and a painful, burning feeling in the bladder or urethra during urination. Older women and men are more likely to be tired, shaky, and weak and have muscle aches and abdominal pain. A fever may mean the infection is in your kidneys. Other symptoms of a kidney infection include pain in your back or sides below the ribs, nausea, and vomiting. DIAGNOSIS To diagnose a UTI, your caregiver will ask you about your symptoms. Your caregiver will also ask you to provide a urine sample. The urine sample will be tested for bacteria and white blood cells. White blood cells are made by your body to help fight infection. TREATMENT  Typically, UTIs can be treated with medication. Because most UTIs are caused by a bacterial infection, they usually can be treated with the use of antibiotics. The choice of antibiotic and  length of treatment depend on your symptoms and the type of bacteria causing your infection. HOME CARE INSTRUCTIONS  If you were prescribed antibiotics, take them exactly as your caregiver instructs you. Finish the medication even if you feel better after you have only taken some of the medication.  Drink enough water and fluids to keep your urine clear or pale yellow.  Avoid caffeine, tea, and carbonated beverages. They tend to irritate your bladder.  Empty your bladder often. Avoid holding urine for long periods of time.  Empty your bladder before and after sexual intercourse.  After a bowel movement, women should cleanse from front to back. Use each tissue only once. SEEK MEDICAL CARE IF:   You have back pain.  You develop a fever.  Your symptoms do not begin to resolve within 3 days. SEEK IMMEDIATE MEDICAL CARE IF:   You have severe back pain or lower abdominal pain.  You develop chills.  You have nausea or vomiting.  You have continued burning or discomfort with urination. MAKE SURE YOU:   Understand these instructions.  Will watch your condition.  Will get help right away if you are not doing well or get worse.   This information is not intended to replace advice given to you by your health care provider. Make sure you discuss any questions you have with your health care provider.   Document Released: 05/06/2005 Document Revised: 04/17/2015 Document Reviewed: 09/04/2011 Elsevier Interactive Patient Education 2016 [employer].  Urine Culture and Sensitivity Testing WHY AM I HAVING THIS TEST?  A urine culture is a test to see if germs grow from your urine sample. Normally, urine is free of germs (sterile). Germs in urine are usually bacteria. Sometimes they can be yeasts. These germs can cause a urinary tract infection (UTI). You may have this test if you have symptoms of a UTI. These may include:  Frequent urination.  Burning pain when passing urine. If you  are pregnant, your health care provider may order this test to screen you for a UTI. When you pass urine, the urine flows through the tube that empties your bladder (urethra). In men, urine comes out through an opening at the tip of the penis. In women, it comes out of the body from just above the vaginal opening. These areas may have bacteria near them that normally live on the skin (normal flora). WHAT KIND OF SAMPLE IS TAKEN? A urine sample for a culture test must be collected in a way that keeps normal flora from getting into the sample. The method used most often is called a clean-catch sample. In a few cases, urine may need to be collected directly from the bladder using a thin, flexible tube (catheter). The health care provider puts the catheter through the person's urethra and into the bladder. Your urine sample will be placed onto plates containing a substance that encourages bacteria to grow (agar plates). These plates are kept at body temperature for 24-48 hours to see if bacteria or other germs grow. Then, a lab technician examines them under a microscope to check for germs. Any germs that grow from the culture will be tested against a variety of medicines to find the one that works best (sensitivity testing). For a UTI caused by bacteria, several types of antibiotic medicines may be tested. HOW DO I PREPARE FOR THE TEST?  Do not urinate for about an hour before collecting the sample.  Drink a glass of water about 20 minutes before collecting the sample.  Tell your health care provider if you have been taking antibiotics. This may affect the results of your test. Your health care provider may give you sterile wipes to clean your vagina or penis to prepare for collecting a clean-catch sample. To collect the sample, you will need to do the following: For Women and Girls  Sit on the toilet and spread the lips of your vagina.  Use one wipe to clean your vaginal area from front to  back.  Use a second wipe to clean the opening of your urethra.  Pass a small amount of urine directly into the toilet while still spreading your vagina.  Then, hold the sterile cup underneath you and urinate into it.  Fill the cup about halfway. Cap it and return it for testing. For Men and Boys  Use the sterile wipe to clean the tip of your penis.  Pass a small amount of urine directly into the toilet first.  Then, urinate into the sterile cup.  Fill the cup about halfway. Cap it and return it for testing. WHAT DO THE RESULTS MEAN? The result of a urine culture and sensitivity test will be positive or negative.   If enough bacteria grow from your urine sample, your test result is considered positive.  If many different bacteria grow from your urine sample, your test may be reported as contaminated.  If no bacteria grow from your sample after 24-48 hours, your test result is considered negative.  Results of sensitivity testing let your health care provider know which  medicines to use to treat your infection. If the results of your urine culture are negative, this means:  It is less likely that you have a UTI.  Your test may be repeated if you still have symptoms. If the results of your urine culture are positive, this means:  It is more likely that you have a UTI.  You may need to start treatment based on your sensitivity results. Talk to your health care provider to discuss your results, treatment options, and if necessary, the need for more tests. It is your responsibility to obtain your test results. Ask the lab or department performing the test when and how you will get your results. Talk with your health care provider if you have any questions about your results.   This information is not intended to replace advice given to you by your health care provider. Make sure you discuss any questions you have with your health care provider.   Document Released: 08/21/2004  Document Revised: 08/17/2014 Document Reviewed: 11/23/2013 Elsevier Interactive Patient Education [employer]2016 Elsevier Inc.

## 2015-10-23 NOTE — ED Notes (Addendum)
Patient states that she was diagnosed with a UTI on March 6 at [facility]. She was prescribed Amoxicillin. Today she reports urinary frequency and lower back pain which started worsening yesterday.  Denies fever/c/n/v or chest pain.

## 2015-10-23 NOTE — ED Provider Notes (Signed)
[facility]  ____________________________________________  Time seen: Approximately 3:38 PM  I have reviewed the triage vital signs and the nursing notes.   HISTORY  Chief Complaint Urinary Frequency  HPI Joyce Rowe is a 50 y.o. female presents for complaints of urinary frequency and urgency. Patient reports symptoms present since last night. Also reports some mild low back aching pain. Patient reports she was seen one 8 days ago at [facility] for similar. States at that time she was having urinary frequency, urgency and burning with urination. States she was initially treated with oral macrobid, but was called 2 days later and changed to amoxicillin because she was told her the culture showed macrobid would not work. States she completed the amoxicillin prescription yesterday. Denies burning with urination at this time. States low back pain is mild and unaffected by position changes. States urinary frequency and urgency returned last night.   Reports continues to eat and drink well. Denies abdominal pain, pelvic pain, vaginal bleeding, vaginal discharge, or vaginal complaints. Denies concerns for STDs.  Denies chance of pregnancy. Denies nausea, vomiting, diarrhea, or fevers.   Denies any history of similar. Denies previous urinary tract infections. Denies known trigger for her current symptoms.  PCP: Edilia Bo  Patient's last menstrual period was 10/01/2015. Denies chance of pregnancy   Past Medical History  Diagnosis Date  . Unspecified essential hypertension   . Morbid obesity with BMI of 40.0-44.9, adult ([place]) 03/28/2014  . Diabetes mellitus type 2, uncontrolled, without complications ([place]) 7/93/9030  . Diabetes mellitus without complication ([place])   . Hyperlipidemia     Patient Active Problem List   Diagnosis Date Noted  . Essential hypertension 08/21/2015  . Diabetes mellitus type 2, uncontrolled, without complications ([place]) 05/02/3006  . Hyperlipidemia LDL goal <70  03/28/2014  . Morbid obesity with BMI of 40.0-44.9, adult ([place]) 03/28/2014    Past Surgical History  Procedure Laterality Date  . Cesarean section  08/2002    Current Outpatient Rx  Name  Route  Sig  Dispense  Refill  . Blood Glucose Monitoring Suppl (ONE TOUCH ULTRA MINI) W/DEVICE KIT      CHECK BLOOD SUGAR TWICE A Rowe AS DIRECTED      0   . hydrochlorothiazide (HYDRODIURIL) 25 MG tablet      Take 1 tablet by mouth  daily   90 tablet   0   . lisinopril (PRINIVIL,ZESTRIL) 20 MG tablet   Oral   Take 1 tablet (20 mg total) by mouth daily.   90 tablet   3   . metFORMIN (GLUCOPHAGE-XR) 500 MG 24 hr tablet   Oral   Take 1 tablet (500 mg total) by mouth daily with breakfast.   90 tablet   3   . Multiple Vitamin (MULTIVITAMIN) capsule   Oral   Take 1 capsule by mouth daily.           . Omega-3 Fatty Acids (FISH OIL) 1000 MG CAPS   Oral   Take 1 capsule by mouth daily.         . ONE TOUCH ULTRA TEST test strip      Use to check blood sugar two times a Rowe.  Dx:  E11.65   100 each   3     Dispense as written.   Glory Rosebush DELICA LANCETS FINE MISC      CHECK BLOOD SUGAR TWICE A Rowe AS DIRECTED      12   . pravastatin (PRAVACHOL) 40 MG tablet  Take 1 tablet by mouth  daily   90 tablet   3     Allergies Metaxalone; Naproxen; and Bextra  Family History  Problem Relation Age of Onset  . Colon cancer      Grandparents  <60  . Heart disease      Grandparents  . Hypertension Mother   . Hypertension Father   . Diabetes Maternal Grandmother   . Breast cancer Neg Hx     Social History Social History  Substance Use Topics  . Smoking status: Never Smoker   . Smokeless tobacco: Never Used  . Alcohol Use: No    Review of Systems Constitutional: No fever/chills Eyes: No visual changes. ENT: No sore throat. Cardiovascular: Denies chest pain. Respiratory: Denies shortness of breath. Gastrointestinal: No abdominal pain.  No nausea, no vomiting.   No diarrhea.  No constipation. Genitourinary: Positive for dysuria. Musculoskeletal: Positive for back pain. Skin: Negative for rash. Neurological: Negative for headaches, focal weakness or numbness.  10-point ROS otherwise negative.  ____________________________________________   PHYSICAL EXAM:  VITAL SIGNS: ED Triage Vitals  Enc Vitals Group     BP 10/23/15 1448 177/89 mmHg     Pulse Rate 10/23/15 1448 112     Resp 10/23/15 1448 20     Temp 10/23/15 1448 98 F (36.7 C)     Temp Source 10/23/15 1448 Oral     SpO2 10/23/15 1448 99 %     Weight 10/23/15 1448 270 lb (122.471 kg)     Height 10/23/15 1448 5' 7.5" (1.715 m)     Head Cir --      Peak Flow --      Pain Score 10/23/15 1455 3     Pain Loc --      Pain Edu? --      Excl. in [place]   10/23/15 1448 10/23/15 1455 10/23/15 1553 10/23/15 1555  BP: 177/89  162/98   Pulse: 112  118   Temp: 98 F (36.7 C)     TempSrc: Oral     Resp: 20  20   Height: 5' 7.5" (1.715 m)     Weight: 270 lb (122.471 kg)     SpO2: 99%     PainSc:  3   3   Patient reports that she is anxious at this time as she gets nervous when she is at physician's offices, and states her heart rate "always" is up when she is nervous.   Constitutional: Alert and oriented. Well appearing and in no acute distress. Eyes: Conjunctivae are normal. PERRL. EOMI. Head: Atraumatic.  Nose: No congestion/rhinnorhea.  Mouth/Throat: Mucous membranes are moist.  Oropharynx non-erythematous. Neck: No stridor.  No cervical spine tenderness to palpation. Hematological/Lymphatic/Immunilogical: No cervical lymphadenopathy. Cardiovascular: Normal rate, regular rhythm. Grossly normal heart sounds.  Good peripheral circulation. Respiratory: Normal respiratory effort.  No retractions. Lungs CTAB. Gastrointestinal: Soft and nontender. Obese abdomen. Normal Bowel sounds. No CVA tenderness. Musculoskeletal: No lower or upper extremity tenderness nor edema.   No cervical, thoracic or lumbar tenderness to palpation. Bilateral pedal pulses equal and easily palpated.  Neurologic:  Normal speech and language. No gross focal neurologic deficits are appreciated. No gait instability. Skin:  Skin is warm, dry and intact. No rash noted. Psychiatric: Mood and affect are normal. Speech and behavior are normal.  ____________________________________________    LABS (all labs ordered are listed, but only abnormal results are displayed)  Labs Reviewed  URINALYSIS COMPLETEWITH MICROSCOPIC [facility]  ONLY) - Abnormal; Notable for the following:    Hgb urine dipstick 2+ (*)    Bacteria, UA FEW (*)    Squamous Epithelial / LPF 6-30 (*)    All other components within normal limits  URINE CULTURE  PREGNANCY, URINE    INITIAL IMPRESSION / ASSESSMENT AND PLAN / ED COURSE  Pertinent labs & imaging results that were available during my care of the patient were reviewed by me and considered in my medical decision making (see chart for details).  Well-appearing patient. No acute distress. Presents for urinary urgency and frequency 2 days. Patient reports last week seen at [facility] diagnosed with a UTI. Reports that she had a an antibiotic change after 2 days of initial antibiotic due to culture results. States completed antibiotic yesterday morning and states that symptoms had resolved but returned yesterday. Denies injury. Also reports some low back pain which is nonreproducible. Abdomen soft and nontender. Lungs clear throughout. Denies fevers. Afebrile.   Urinalysis positive for few bacteria, 2+ hemoglobin and 6-30 RBCs as well as 6-30 squamous epithelial cells. Will culture urine. Discussed with patient possibility of contaminated urinalysis. Also discussed with patient possibility of incomplete treatment of UTI from last week's antibiotic course. Discussed with patient evaluating by KUB for evaluation of possible stones, patient states at this time she does not want a  KUB performed and states will follow-up with her primary care physician. Suspect incomplete UTI treatment from last week's amoxicillin, will culture urine as well as initiate oral antibiotics. Will treat patient with oral Cipro twice a Rowe 7 days. Encouraged rest, fluids, void post intercourse. Counseled patient importance of closely following up with primary care physician. Encouraged follow-up at least 1 week for recheck and urinalysis to ensure clearance.  Discussed follow up with Primary care physician this week. Discussed follow up and return parameters  to urgent care or ER including  fevers, abdominal pain, inability to tolerate food or fluids, no resolution or any worsening concerns. Patient verbalized understanding and agreed to plan.   ____________________________________________   FINAL CLINICAL IMPRESSION(S) / ED DIAGNOSES  Final diagnoses:  UTI (lower urinary tract infection)      Note: This dictation was prepared with Dragon dictation along with smaller phrase technology. Any transcriptional errors that result from this process are unintentional.    Marylene Land, NP 10/23/15 1631

## 2015-10-25 LAB — URINE CULTURE

## 2015-11-04 ENCOUNTER — Encounter: Payer: Self-pay | Admitting: Family Medicine

## 2015-11-04 ENCOUNTER — Ambulatory Visit (INDEPENDENT_AMBULATORY_CARE_PROVIDER_SITE_OTHER): Payer: 59 | Admitting: Family Medicine

## 2015-11-04 VITALS — BP 160/80 | HR 66 | Temp 98.0°F | Ht 67.5 in | Wt 272.2 lb

## 2015-11-04 DIAGNOSIS — R3129 Other microscopic hematuria: Secondary | ICD-10-CM | POA: Diagnosis not present

## 2015-11-04 DIAGNOSIS — B379 Candidiasis, unspecified: Secondary | ICD-10-CM

## 2015-11-04 DIAGNOSIS — N390 Urinary tract infection, site not specified: Secondary | ICD-10-CM

## 2015-11-04 LAB — POC URINALSYSI DIPSTICK (AUTOMATED)
Bilirubin, UA: NEGATIVE
GLUCOSE UA: NEGATIVE
Ketones, UA: NEGATIVE
NITRITE UA: NEGATIVE
PH UA: 6
PROTEIN UA: NEGATIVE
UROBILINOGEN UA: 0.2

## 2015-11-04 LAB — POCT WET + KOH PREP: TRICH BY WET PREP: ABSENT

## 2015-11-04 MED ORDER — FLUCONAZOLE 150 MG PO TABS: 150.0000 mg | ORAL_TABLET | Freq: Once | ORAL | Status: DC

## 2015-11-04 NOTE — Progress Notes (Signed)
Dr. Frederico Hamman T. Jeffrie Lofstrom, MD, [facility] and [facility] [place], [address] Phone: [phone] Fax: [phone]  11/04/2015  Patient: Joyce Rowe, MRN: [id number], DOB: 12-Sep-1965, 50 y.o.  Primary Physician:  Owens Loffler, MD   Chief Complaint  Patient presents with  . Follow-up    UTI-seen at Urgent Care in [place]  . Vaginal Itching & Burning   Subjective:   Joyce Rowe is a 50 y.o. very pleasant female patient who presents with the following:  Fastmed, put on nitrofurantoin - took the full dose. Then went to urgent care. Menses ended on the 10/28/2015. Changed to amoxicillin with culture.   Then went to [facility] urgent care and given cipro.   Now: Yeast infection. Externally itchy with increased discharge.  Mucousy discharge over the weekend and exterior.   Microscopic hematuria.  Past Medical History, Surgical History, Social History, Family History, Problem List, Medications, and Allergies have been reviewed and updated if relevant.  Patient Active Problem List   Diagnosis Date Noted  . Essential hypertension 08/21/2015  . Diabetes mellitus type 2, uncontrolled, without complications ([place]) 27/61/4709  . Hyperlipidemia LDL goal <70 03/28/2014  . Morbid obesity with BMI of 40.0-44.9, adult ([place]) 03/28/2014    Past Medical History  Diagnosis Date  . Unspecified essential hypertension   . Morbid obesity with BMI of 40.0-44.9, adult ([place]) 03/28/2014  . Diabetes mellitus type 2, uncontrolled, without complications ([place]) 2/95/7473  . Diabetes mellitus without complication ([place])   . Hyperlipidemia     Past Surgical History  Procedure Laterality Date  . Cesarean section  08/2002    Social History   Social History  . Marital Status: Married    Spouse Name: N/A  . Number of Children: 1  . Years of Education: N/A   Occupational History  . BS, SAHM    Social History Main Topics  . Smoking status: Never Smoker     . Smokeless tobacco: Never Used  . Alcohol Use: No  . Drug Use: No  . Sexual Activity: Not on file   Other Topics Concern  . Not on file   Social History Narrative   Married      1 child      BS, SAHM      6-8 hours sleep per night      3 people living in home      Regular exercise-yes    Family History  Problem Relation Age of Onset  . Colon cancer      Grandparents  <60  . Heart disease      Grandparents  . Hypertension Mother   . Hypertension Father   . Diabetes Maternal Grandmother   . Breast cancer Neg Hx     Allergies  Allergen Reactions  . Metaxalone     REACTION: Rash  . Naproxen     REACTION: Rash  . Bextra [Valdecoxib] Rash    Medication list reviewed and updated in full in [place].   GEN: No acute illnesses, no fevers, chills - feeling better GI: No n/v/d, eating normally Pulm: No SOB Interactive and getting along well at home.  Otherwise, ROS is as per the HPI.  Objective:   BP 160/80 mmHg  Pulse 66  Temp(Src) 98 F (36.7 C) (Oral)  Ht 5' 7.5" (1.715 m)  Wt 272 lb 4 oz (123.492 kg)  BMI 41.99 kg/m2  LMP 10/01/2015  GEN: WDWN, NAD, Non-toxic, A &  O x 3 HEENT: Atraumatic, Normocephalic. Neck supple. No masses, No LAD. Ears and Nose: No external deformity. CV: RRR, No M/G/R. No JVD. No thrill. No extra heart sounds. PULM: CTA B, no wheezes, crackles, rhonchi. No retractions. No resp. distress. No accessory muscle use. GU: external irritation and redness. Swab obtained. This portion of the physical examination was chaperoned by Hedy Camara, CMA.   EXTR: No c/c/e NEURO Normal gait.  PSYCH: Normally interactive. Conversant. Not depressed or anxious appearing.  Calm demeanor.   Laboratory and Imaging Data: Results for orders placed or performed in visit on 11/04/15  POCT Urinalysis Dipstick (Automated)  Result Value Ref Range   Color, UA yellow    Clarity, UA clear    Glucose, UA negative    Bilirubin, UA negative     Ketones, UA negative    Spec Grav, UA >=1.030    Blood, UA large    pH, UA 6.0    Protein, UA negative    Urobilinogen, UA 0.2    Nitrite, UA negative    Leukocytes, UA Trace (A) Negative  POCT Wet + KOH Prep  Result Value Ref Range   Yeast by KOH  Present, Absent   Yeast by wet prep Present Present, Absent   WBC by wet prep Moderate (A) None, Few, Too numerous to count   Clue Cells Wet Prep HPF POC None None, Too numerous to count   Trich by wet prep Absent Present, Absent   Bacteria Wet Prep HPF POC Moderate (A) None, Few, Too numerous to count   Epithelial Cells By [employer] Pref ([facility]FC) Moderate (A) None, Few, Too numerous to count   RBC,UR,HPF,POC None None RBC/hpf     Assessment and Plan:   Microscopic hematuria - Plan: Ambulatory referral to Urology  Recurrent UTI - Plan: POCT Urinalysis Dipstick (Automated)  Yeast infection - Plan: POCT Wet + KOH Prep  Current yeast infection. UTI cleared.   Urine with blood x 2, even without active UTI. D/w patient - no gross hematuria, but further urological work-up is appropriate.   Follow-up: No Follow-up on file.  New Prescriptions   FLUCONAZOLE (DIFLUCAN) 150 MG TABLET    Take 1 tablet (150 mg total) by mouth once. May repeat in 1 week if needed   Orders Placed This Encounter  Procedures  . Ambulatory referral to Urology  . POCT Urinalysis Dipstick (Automated)  . POCT Wet + KOH Prep    Signed,  Zarrah Loveland T. Jarrah Babich, MD   Patient's Medications  New Prescriptions   FLUCONAZOLE (DIFLUCAN) 150 MG TABLET    Take 1 tablet (150 mg total) by mouth once. May repeat in 1 week if needed  Previous Medications   BLOOD GLUCOSE MONITORING SUPPL (ONE TOUCH ULTRA MINI) W/DEVICE KIT    CHECK BLOOD SUGAR TWICE A DAY AS DIRECTED   HYDROCHLOROTHIAZIDE (HYDRODIURIL) 25 MG TABLET    Take 1 tablet by mouth  daily   LISINOPRIL (PRINIVIL,ZESTRIL) 20 MG TABLET    Take 1 tablet (20 mg total) by mouth daily.   METFORMIN (GLUCOPHAGE-XR) 500 MG 24 HR  TABLET    Take 1 tablet (500 mg total) by mouth daily with breakfast.   MULTIPLE VITAMIN (MULTIVITAMIN) CAPSULE    Take 1 capsule by mouth daily.     OMEGA-3 FATTY ACIDS (FISH OIL) 1000 MG CAPS    Take 1 capsule by mouth daily.   ONE TOUCH ULTRA TEST TEST STRIP    Use to check blood sugar two times a  day.  Dx:  Y92.44   ONETOUCH DELICA LANCETS FINE MISC    CHECK BLOOD SUGAR TWICE A DAY AS DIRECTED   PRAVASTATIN (PRAVACHOL) 40 MG TABLET    Take 1 tablet by mouth  daily  Modified Medications   No medications on file  Discontinued Medications   CIPROFLOXACIN (CIPRO) 500 MG TABLET    Take 1 tablet (500 mg total) by mouth 2 (two) times daily.

## 2015-11-04 NOTE — Progress Notes (Signed)
Pre visit review using our clinic review tool, if applicable. No additional management support is needed unless otherwise documented below in the visit note. 

## 2015-11-04 NOTE — Patient Instructions (Signed)

## 2015-11-12 ENCOUNTER — Ambulatory Visit (INDEPENDENT_AMBULATORY_CARE_PROVIDER_SITE_OTHER): Payer: 59 | Admitting: Urology

## 2015-11-12 ENCOUNTER — Encounter: Payer: Self-pay | Admitting: Urology

## 2015-11-12 VITALS — BP 161/95 | HR 109 | Ht 67.75 in | Wt 268.4 lb

## 2015-11-12 DIAGNOSIS — R3129 Other microscopic hematuria: Secondary | ICD-10-CM | POA: Diagnosis not present

## 2015-11-12 DIAGNOSIS — R109 Unspecified abdominal pain: Secondary | ICD-10-CM

## 2015-11-12 NOTE — Progress Notes (Signed)
11/12/2015 2:03 PM   Joyce Rowe 28-May-1966 [id number]  Referring provider: Owens Loffler, MD [facility] [address] [place], [place] [address]  Chief Complaint  Patient presents with  . Hematuria    referred by Dr. Donella Stade    HPI: Patient is a 50 -year-old [place] female who presents today as a referral from their PCP, Dr.Copeland, for microscopic hematuria.  Patient was found to have microscopic hematuria on 10/23/2015 with 6-30 RBC's/hpf.  Patient doesn't have a prior history of microscopic hematuria.    She does not have a prior history of recurrent urinary tract infections, nephrolithiasis, trauma to the genitourinary tract or malignancies of the genitourinary tract.   She does have a family medical history of nephrolithiasis, malignancies of the genitourinary tract or hematuria.   Today, she is incontinence that started a few weeks ago when she developed an URI.  Her UA today demonstrates >30 RBC's/hpf.    They are not experiencing any suprapubic pain or abdominal pain, but she has had left flank pain. She had fevers, chills, nausea and vomiting two weeks ago.  She assumed it was the antibiotic.    She has not had any recent imaging studies.  She is not exposed to secondhand smoke.  She has worked with carcinogen as a Estate manager/land agent.      PMH: Past Medical History  Diagnosis Date  . Unspecified essential hypertension   . Morbid obesity with BMI of 40.0-44.9, adult ([place]) 03/28/2014  . Diabetes mellitus type 2, uncontrolled, without complications ([place]) 2/83/6629  . Diabetes mellitus without complication ([place])   . Hyperlipidemia   . Microscopic hematuria     Surgical History: Past Surgical History  Procedure Laterality Date  . Cesarean section  08/2002  . Tubes in ears      70's    Home Medications:    Medication List       This list is accurate as of: 11/12/15  2:03 PM.  Always use your most recent med list.               Fish Oil 1000 MG Caps  Take 1 capsule by mouth daily.     fluconazole 150 MG tablet  Commonly known as:  DIFLUCAN  Take 1 tablet (150 mg total) by mouth once. May repeat in 1 week if needed     hydrochlorothiazide 25 MG tablet  Commonly known as:  HYDRODIURIL  Take 1 tablet by mouth  daily     lisinopril 20 MG tablet  Commonly known as:  PRINIVIL,ZESTRIL  Take 1 tablet (20 mg total) by mouth daily.     metFORMIN 500 MG 24 hr tablet  Commonly known as:  GLUCOPHAGE-XR  Take 1 tablet (500 mg total) by mouth daily with breakfast.     multivitamin capsule  Take 1 capsule by mouth daily.     ONE TOUCH ULTRA MINI w/Device Kit  CHECK BLOOD SUGAR TWICE A DAY AS DIRECTED     ONE TOUCH ULTRA TEST test strip  Generic drug:  glucose blood  Use to check blood sugar two times a day.  Dx:  U76.54     ONETOUCH DELICA LANCETS FINE Misc  CHECK BLOOD SUGAR TWICE A DAY AS DIRECTED     pravastatin 40 MG tablet  Commonly known as:  PRAVACHOL  Take 1 tablet by mouth  daily        Allergies:  Allergies  Allergen Reactions  . Metaxalone  REACTION: Rash  . Naproxen     REACTION: Rash  . Bextra [Valdecoxib] Rash    Family History: Family History  Problem Relation Age of Onset  . Colon cancer      Grandparents  <60  . Heart disease      Grandparents  . Hypertension Mother   . Hypertension Father   . Diabetes Maternal Grandmother   . Breast cancer Neg Hx   . Hematuria Father   . Kidney disease Neg Hx   . Bladder Cancer Neg Hx     Social History:  reports that she has never smoked. She has never used smokeless tobacco. She reports that she does not drink alcohol or use illicit drugs.  ROS: UROLOGY Frequent Urination?: No Hard to postpone urination?: No Burning/pain with urination?: No Get up at night to urinate?: No Leakage of urine?: Yes Urine stream starts and stops?: No Trouble starting stream?: No Do you have to strain to urinate?: No Blood in urine?:  Yes Urinary tract infection?: No Sexually transmitted disease?: No Injury to kidneys or bladder?: No Painful intercourse?: No Weak stream?: No Currently pregnant?: No Vaginal bleeding?: No Last menstrual period?: 10/25/15  Gastrointestinal Nausea?: Yes Vomiting?: Yes Indigestion/heartburn?: No Diarrhea?: Yes Constipation?: No  Constitutional Fever: No Night sweats?: No Weight loss?: No Fatigue?: No  Skin Skin rash/lesions?: No Itching?: No  Eyes Blurred vision?: No Double vision?: No  Ears/Nose/Throat Sore throat?: No Sinus problems?: No  Hematologic/Lymphatic Swollen glands?: No Easy bruising?: No  Cardiovascular Leg swelling?: No Chest pain?: No  Respiratory Cough?: Yes Shortness of breath?: No  Endocrine Excessive thirst?: No  Musculoskeletal Back pain?: No Joint pain?: No  Neurological Headaches?: No Dizziness?: No  Psychologic Depression?: No Anxiety?: No  Physical Exam: BP 161/95 mmHg  Pulse 109  Ht 5' 7.75" (1.721 m)  Wt 268 lb 6.4 oz (121.745 kg)  BMI 41.10 kg/m2  LMP 10/25/2015  Constitutional: Well nourished. Alert and oriented, No acute distress. HEENT: [place] AT, moist mucus membranes. Trachea midline, no masses. Cardiovascular: No clubbing, cyanosis, or edema. Respiratory: Normal respiratory effort, no increased work of breathing. GI: Abdomen is soft, non tender, non distended, no abdominal masses. Liver and spleen not palpable.  No hernias appreciated.  Stool sample for occult testing is not indicated.   GU: No CVA tenderness.  No bladder fullness or masses.   Skin: No rashes, bruises or suspicious lesions. Lymph: No cervical or inguinal adenopathy. Neurologic: Grossly intact, no focal deficits, moving all 4 extremities. Psychiatric: Normal mood and affect.  Laboratory Data: Lab Results  Component Value Date   WBC 9.1 03/22/2015   HGB 16.2* 03/23/2014   HCT 43.7 03/22/2015   MCV 82 03/22/2015   PLT 265 03/22/2015     Lab Results  Component Value Date   CREATININE 0.79 05/06/2015    Lab Results  Component Value Date   HGBA1C 6.3* 08/15/2015    Lab Results  Component Value Date   TSH 0.903 03/22/2015       Component Value Date/Time   CHOL 208* 03/22/2015 1036   HDL 46 03/22/2015 1036   LDLCALC 88 03/22/2015 1036    Lab Results  Component Value Date   AST 26 03/22/2015   Lab Results  Component Value Date   ALT 29 03/22/2015    Urinalysis Results for orders placed or performed in visit on 11/12/15  Microscopic Examination  Result Value Ref Range   WBC, UA 0-5 0 -  5 /hpf   RBC,  UA >30 (A) 0 -  2 /hpf   Epithelial Cells (non renal) 0-10 0 - 10 /hpf   Bacteria, UA None seen None seen/Few  Urinalysis, Complete  Result Value Ref Range   Specific Gravity, UA 1.010 1.005 - 1.030   pH, UA 6.0 5.0 - 7.5   Color, UA Yellow Yellow   Appearance Ur Cloudy (A) Clear   Leukocytes, UA Trace (A) Negative   Protein, UA Negative Negative/Trace   Glucose, UA Negative Negative   Ketones, UA Negative Negative   RBC, UA 3+ (A) Negative   Bilirubin, UA Negative Negative   Urobilinogen, Ur 0.2 0.2 - 1.0 mg/dL   Nitrite, UA Negative Negative   Microscopic Examination See below:   BUN+Creat  Result Value Ref Range   BUN 15 6 - 24 mg/dL   Creatinine, Ser 0.73 0.57 - 1.00 mg/dL   GFR calc non Af Amer 98 >59 mL/min/1.73   GFR calc Af Amer 113 >59 mL/min/1.73   BUN/Creatinine Ratio 21 9 - 23  hCG, serum, qualitative  Result Value Ref Range   hCG,Beta Subunit,Qual,Serum Negative Negative <6 mIU/mL     Assessment & Plan:    1. Microscopic hematuria:  Explained to patient the causes of blood in the urine are as follows: stones, UTI's, damage to the urinary tract and/or cancer.  It is explained to the patient that they will be scheduled for a CT Urogram with contrast material and that in rare instances, an allergic reaction can be serious and even life threatening with the injection of  contrast material.   The patient denies any allergies to contrast, iodine and/or seafood and is taking metformin.  I have explained to the patient that they will  be scheduled for a cystoscopy in our office to evaluate their bladder.  The cystoscopy consists of passing a tube with a lens up through their urethra and into their urinary bladder.   We will inject the urethra with a lidocaine gel prior to introducing the cystoscope to help with any discomfort during the procedure.   After the procedure, they might experience blood in the urine and discomfort with urination.  This will abate after the first few voids.  I have  encouraged the patient to increase water intake  during this time.  Patient denies any allergies to lidocaine.   - Urinalysis, Complete - CULTURE, URINE COMPREHENSIVE - BUN+Creat  2. Left flank pain:   Patient has been having intermittent left flank pain over the last few weeks.  She will be undergoing a CT Urogram in the future.     Return for CT Urogram report and cystoscopy.  These notes generated with voice recognition software. I apologize for typographical errors.  Zara Council, [facility] [address], [place], [place] [address] [phone]

## 2015-11-13 LAB — URINALYSIS, COMPLETE
BILIRUBIN UA: NEGATIVE
Glucose, UA: NEGATIVE
Ketones, UA: NEGATIVE
NITRITE UA: NEGATIVE
PH UA: 6 (ref 5.0–7.5)
Protein, UA: NEGATIVE
Specific Gravity, UA: 1.01 (ref 1.005–1.030)
UUROB: 0.2 mg/dL (ref 0.2–1.0)

## 2015-11-13 LAB — MICROSCOPIC EXAMINATION
Bacteria, UA: NONE SEEN
RBC, UA: >30 /hpf — AB (ref 0–?)

## 2015-11-13 LAB — BUN+CREAT
BUN / CREAT RATIO: 21 (ref 9–23)
BUN: 15 mg/dL (ref 6–24)
Creatinine, Ser: 0.73 mg/dL (ref 0.57–1.00)
GFR, EST AFRICAN AMERICAN: 113 mL/min/{1.73_m2} (ref >59)
GFR, EST NON AFRICAN AMERICAN: 98 mL/min/{1.73_m2} (ref >59)

## 2015-11-13 LAB — HCG, SERUM, QUALITATIVE: HCG, BETA SUBUNIT, QUAL, SERUM: NEGATIVE m[IU]/mL (ref <6)

## 2015-11-14 LAB — CULTURE, URINE COMPREHENSIVE

## 2015-11-18 ENCOUNTER — Telehealth: Payer: Self-pay

## 2015-11-18 DIAGNOSIS — N390 Urinary tract infection, site not specified: Secondary | ICD-10-CM

## 2015-11-18 MED ORDER — AMOXICILLIN-POT CLAVULANATE 875-125 MG PO TABS: 1.0000 | ORAL_TABLET | Freq: Two times a day (BID) | ORAL | Status: AC

## 2015-11-18 NOTE — Telephone Encounter (Signed)
LMOM- medication sent to pharmacy.  Ampicillin does not come in 875. Per Dr. Berneice HeinrichManny pt was given augmentin.

## 2015-11-18 NOTE — Telephone Encounter (Signed)
-----   Message from Harle BattiestShannon A McGowan, PA-C sent at 11/14/2015  8:03 PM EDT ----- Patient has a +UCx.  They need to start Ampicillin 875 mg one twice daily for seven days and then we need to check a  specimen in 3 to 5 days after they complete their antibiotics.

## 2015-11-19 NOTE — Telephone Encounter (Signed)
Spoke with pt in reference to +ucx. Made aware medication has been sent to her pharmacy. Pt voiced understanding.

## 2015-11-25 ENCOUNTER — Ambulatory Visit
Admission: RE | Admit: 2015-11-25 | Discharge: 2015-11-25 | Disposition: A | Discharge status: Home / Self Care | Payer: 59 | Source: Ambulatory Visit | Attending: Urology | Admitting: Urology

## 2015-11-25 DIAGNOSIS — R1900 Intra-abdominal and pelvic swelling, mass and lump, unspecified site: Secondary | ICD-10-CM | POA: Diagnosis not present

## 2015-11-25 DIAGNOSIS — R3129 Other microscopic hematuria: Secondary | ICD-10-CM

## 2015-11-25 DIAGNOSIS — N200 Calculus of kidney: Secondary | ICD-10-CM | POA: Diagnosis not present

## 2015-11-25 MED ORDER — IOPAMIDOL (ISOVUE-370) INJECTION 76%
125.0000 mL | Freq: Once | INTRAVENOUS | Status: AC | PRN
  Administered 2015-11-25: 125 mL via INTRAVENOUS

## 2015-12-06 ENCOUNTER — Telehealth: Payer: Self-pay | Admitting: Urology

## 2015-12-06 ENCOUNTER — Encounter: Payer: Self-pay | Admitting: Urology

## 2015-12-06 ENCOUNTER — Ambulatory Visit (INDEPENDENT_AMBULATORY_CARE_PROVIDER_SITE_OTHER): Payer: 59 | Admitting: Urology

## 2015-12-06 ENCOUNTER — Other Ambulatory Visit: Payer: Self-pay | Admitting: Radiology

## 2015-12-06 ENCOUNTER — Telehealth: Payer: Self-pay | Admitting: Radiology

## 2015-12-06 VITALS — BP 177/115 | HR 112 | Ht 68.0 in | Wt 274.1 lb

## 2015-12-06 DIAGNOSIS — N200 Calculus of kidney: Secondary | ICD-10-CM

## 2015-12-06 DIAGNOSIS — R3129 Other microscopic hematuria: Secondary | ICD-10-CM | POA: Diagnosis not present

## 2015-12-06 DIAGNOSIS — N83201 Unspecified ovarian cyst, right side: Secondary | ICD-10-CM | POA: Diagnosis not present

## 2015-12-06 DIAGNOSIS — N83202 Unspecified ovarian cyst, left side: Secondary | ICD-10-CM

## 2015-12-06 LAB — URINALYSIS, COMPLETE
Bilirubin, UA: NEGATIVE
GLUCOSE, UA: NEGATIVE
KETONES UA: NEGATIVE
Nitrite, UA: NEGATIVE
SPEC GRAV UA: 1.025 (ref 1.005–1.030)
Urobilinogen, Ur: 0.2 mg/dL (ref 0.2–1.0)
pH, UA: 6 (ref 5.0–7.5)

## 2015-12-06 LAB — MICROSCOPIC EXAMINATION
BACTERIA UA: NONE SEEN
Epithelial Cells (non renal): >10 /hpf — AB (ref 0–10)
RBC, UA: >30 /hpf — AB (ref 0–?)

## 2015-12-06 MED ORDER — CIPROFLOXACIN HCL 500 MG PO TABS
500.0000 mg | ORAL_TABLET | Freq: Once | ORAL | Status: AC
  Administered 2015-12-06: 500 mg via ORAL

## 2015-12-06 MED ORDER — LIDOCAINE HCL 2 % EX GEL
1.0000 "application " | Freq: Once | CUTANEOUS | Status: AC
  Administered 2015-12-06: 1 via URETHRAL

## 2015-12-06 NOTE — Telephone Encounter (Signed)
Will call.

## 2015-12-06 NOTE — Telephone Encounter (Signed)
LMOM. Need to notify pt to get KUB prior to f/u appt with Carollee HerterShannon on 12/26/15.

## 2015-12-06 NOTE — Telephone Encounter (Signed)
Advised pt to get KUB at the [facility] prior to f/u appt on 5/18. Also notified pt that per her insurance company the procedure will be covered by 80% once deductible has been met. Pt voices understanding.

## 2015-12-06 NOTE — Progress Notes (Signed)
1:00 PM  12/06/2015  Joyce Rowe May 05, 1966 [id number]  Referring provider: Owens Loffler, MD [facility] [address] [place], [place] [address]  Chief Complaint  Patient presents with  . Cysto    HPI: 50 -year-old [place]  Female who returns today for completion of her microscopic hematuria workup. She presents today to the office for cystoscopy. She is rather anxious about this procedure.  She has had episodes of intermittent left flank pain over the past few weeks. She was treated recently for urinary tract infection as well.  CT urogram review today. The stent show a large 18 mm left UPJ stone without hydronephrosis.  She also has a smaller left lower pole stone as well.  She also has bilateral ovarian cysts. She has not yet menopausal.  No previous history of kidney stones.    PMH: Past Medical History  Diagnosis Date  . Unspecified essential hypertension   . Morbid obesity with BMI of 40.0-44.9, adult ([place]) 03/28/2014  . Diabetes mellitus type 2, uncontrolled, without complications ([place]) 01/24/8371  . Diabetes mellitus without complication ([place])   . Hyperlipidemia   . Microscopic hematuria     Surgical History: Past Surgical History  Procedure Laterality Date  . Cesarean section  08/2002  . Tubes in ears      70's    Home Medications:    Medication List       This list is accurate as of: 12/06/15  1:00 PM.  Always use your most recent med list.               Fish Oil 1000 MG Caps  Take 1 capsule by mouth daily.     fluconazole 150 MG tablet  Commonly known as:  DIFLUCAN  Take 1 tablet (150 mg total) by mouth once. May repeat in 1 week if needed     hydrochlorothiazide 25 MG tablet  Commonly known as:  HYDRODIURIL  Take 1 tablet by mouth  daily     lisinopril 20 MG tablet  Commonly known as:  PRINIVIL,ZESTRIL  Take 1 tablet (20 mg total) by mouth daily.     metFORMIN 500 MG 24 hr tablet  Commonly known as:   GLUCOPHAGE-XR  Take 1 tablet (500 mg total) by mouth daily with breakfast.     multivitamin capsule  Take 1 capsule by mouth daily.     ONE TOUCH ULTRA MINI w/Device Kit  CHECK BLOOD SUGAR TWICE A DAY AS DIRECTED     ONE TOUCH ULTRA TEST test strip  Generic drug:  glucose blood  Use to check blood sugar two times a day.  Dx:  B02.11     ONETOUCH DELICA LANCETS FINE Misc  CHECK BLOOD SUGAR TWICE A DAY AS DIRECTED     pravastatin 40 MG tablet  Commonly known as:  PRAVACHOL  Take 1 tablet by mouth  daily        Allergies:  Allergies  Allergen Reactions  . Metaxalone     REACTION: Rash  . Naproxen     REACTION: Rash  . Bextra [Valdecoxib] Rash    Family History: Family History  Problem Relation Age of Onset  . Colon cancer      Grandparents  <60  . Heart disease      Grandparents  . Hypertension Mother   . Hypertension Father   . Diabetes Maternal Grandmother   . Breast cancer Neg Hx   . Hematuria Father   .  Kidney disease Neg Hx   . Bladder Cancer Neg Hx     Social History:  reports that she has never smoked. She has never used smokeless tobacco. She reports that she does not drink alcohol or use illicit drugs.  Physical Exam: BP 177/115 mmHg  Pulse 112  Ht 5' 8" (1.727 m)  Wt 274 lb 1.6 oz (124.331 kg)  BMI 41.69 kg/m2  LMP 10/25/2015  Constitutional: Well nourished. Alert and oriented, No acute distress. HEENT: [place] AT, moist mucus membranes. Trachea midline, no masses. Cardiovascular: No clubbing, cyanosis, or edema. Respiratory: Normal respiratory effort, no increased work of breathing. GI: Abdomen is soft, non tender, non distended, no abdominal masses. Liver and spleen not palpable.  No hernias appreciated.  Stool sample for occult testing is not indicated.   GU: Normal external genitalia. Normal urethral meatus. Skin: No rashes, bruises or suspicious lesions. Lymph: No cervical or inguinal adenopathy. Neurologic: Grossly intact, no focal deficits,  moving all 4 extremities. Psychiatric: Normal mood and affect.  Laboratory Data: Lab Results  Component Value Date   WBC 9.1 03/22/2015   HGB 16.2* 03/23/2014   HCT 43.7 03/22/2015   MCV 82 03/22/2015   PLT 265 03/22/2015    Lab Results  Component Value Date   CREATININE 0.73 11/12/2015    Lab Results  Component Value Date   HGBA1C 6.3* 08/15/2015    Lab Results  Component Value Date   TSH 0.903 03/22/2015       Component Value Date/Time   CHOL 208* 03/22/2015 1036   HDL 46 03/22/2015 1036   LDLCALC 88 03/22/2015 1036    Lab Results  Component Value Date   AST 26 03/22/2015   Lab Results  Component Value Date   ALT 29 03/22/2015    Urinalysis UA reviewed, no evidence of infection  Imaging Study Result     CLINICAL DATA: Recent UTI with micro hematuria.  EXAM: CT ABDOMEN AND PELVIS WITHOUT AND WITH CONTRAST  TECHNIQUE: Multidetector CT imaging of the abdomen and pelvis was performed following the standard protocol before and following the bolus administration of intravenous contrast.  CONTRAST: 125 cc Isovue 370  COMPARISON: None.  FINDINGS: Lower chest: Unremarkable.  Hepatobiliary: No focal abnormality within the liver parenchyma. There is no evidence for gallstones, gallbladder wall thickening, or pericholecystic fluid. No intrahepatic or extrahepatic biliary dilation.  Pancreas: No focal mass lesion. No dilatation of the main duct. No intraparenchymal cyst. No peripancreatic edema.  Spleen: No splenomegaly. No focal mass lesion.  Adrenals/Urinary Tract: No adrenal nodule or mass. Right kidney is unremarkable. 18 x 8 x 17 mm stone is identified in the left renal pelvis. For for 5 additional tiny stones are clustered in the left lower pole collecting system, ranging in size from 1-2 mm up to 4 mm. No ureteral or bladder stones.  Imaging after intravenous contrast administration shows no enhancing lesion in either  kidney. Delayed imaging shows good opacification of the intrarenal collecting systems bilaterally. Left renal stones are again noted, but otherwise no filling defect within the opacified intrarenal collecting system or renal pelvis of either kidney. Right ureter is well opacified and has normal imaging features. Proximal 1/3 of the left ureter is not opacified, but left ureter is otherwise unremarkable. Delayed imaging through the bladder shows no focal bladder wall abnormality.  Stomach/Bowel: Stomach is nondistended. No gastric wall thickening. No evidence of outlet obstruction. Small duodenum diverticulum noted. No small bowel wall thickening. No small bowel dilatation. The terminal   ileum is normal. The appendix is normal. No gross colonic mass. No colonic wall thickening. No substantial diverticular change.  Vascular/Lymphatic: There is no gastrohepatic or hepatoduodenal ligament lymphadenopathy. No intraperitoneal or retroperitoneal lymphadenopathy. Small lymph nodes are evident in the hepatoduodenal ligament. No pelvic sidewall lymphadenopathy.  Reproductive: Uterus is unremarkable. 3.7 x 5.7 x 4.6 cm cystic mass is identified in the left ovary. 4.2 x 7.8 x 8.4 cm cystic mass is identified in the right ovary.  Other: No intraperitoneal free fluid.  Musculoskeletal: Sclerotic changes are seen at the symphysis pubis and SI joints bilaterally. Bone windows reveal no worrisome lytic or sclerotic osseous lesions.  IMPRESSION: 1. 18 x 8 x 17 mm nonobstructing stone identified in the left renal pelvis with clustered tiny stones in the left lower pole collecting systems. Otherwise unremarkable CT evaluation of the urinary tract. 2. Bilateral cystic ovarian masses. Based on the size of these lesions, if the patient is premenopausal, follow-up ultrasound exam in 6-12 weeks is recommended to assess for resolution. If the patient is postmenopausal, ultrasound exam is recommended  at this time to further characterize. This recommendation follows ACR consensus guidelines: White Paper of the ACR Incidental Findings Committee II on Adnexal Findings. J Am Coll Radiol [phone].   Electronically Signed  By: Misty Stanley M.D.  On: 11/25/2015 12:06   CT scan was reviewed personally and with the patient   Cystoscopy Procedure Note  Patient identification was confirmed, informed consent was obtained, and patient was prepped using Betadine solution.  Lidocaine jelly was administered per urethral meatus.    Preoperative abx where received prior to procedure.    Procedure: - Flexible cystoscope introduced, without any difficulty.   - Thorough search of the bladder revealed:    normal urethral meatus    normal urothelium with mild trigonitis    no stones    no ulcers     no tumors    no urethral polyps    no trabeculation   Uterine impression noted posteriorly  - Ureteral orifices were normal in position and appearance.  Post-Procedure: - Patient tolerated the procedure well    Assessment & Plan:    1. Microscopic hematuria Ask me today negative. Ronalee Belts scopic hematuria likely related to kidney stone. - Urinalysis, Complete - ciprofloxacin (CIPRO) tablet 500 mg; Take 1 tablet (500 mg total) by mouth once. - lidocaine (XYLOCAINE) 2 % jelly 1 application; Place 1 application into the urethra once.  2. Calculus of left kidney Large 18 mm left UPJ stone with some smaller nonobstructing lower pole stones. She is occasionally symptomatic. Given the size of the stone, I have recommended further treatment.  We discussed various treatment options including ESWL (with and without stent) vs. ureteroscopy, laser lithotripsy, and stent vs PCNL. We discussed the risks and benefits of each including bleeding, infection, damage to surrounding structures, efficacy with need for possible further intervention, and need for temporary ureteral stent.  She is very  anxious about having surgical intervention and general anesthesia. She would like to attempt shockwave lithotripsy in order to avoid general anesthesia and invasive surgery. I explained in detail today that given the size of her stone, she is at fairly significant risk for developing Steinstrasse, ureteral obstruction, and high risk of needing multiple procedures including staged ESWL versus ureteroscopy down the road. She understands this and is willing to proceed spite the risk. She was offered a preoperative stent but declined.  3. Cysts of both ovaries Additional bilateral ovarian cysts. She is premenopausal.  Findings were reviewed with the patient. As such, recommend follow-up pelvic ultrasound in 6-12 weeks per radiology recommendations.   These notes generated with voice recognition software. I apologize for typographical errors.  Hollice Espy, MD  [facility] [address], [place], [place] [address] [phone]  [email or web address]

## 2015-12-06 NOTE — Telephone Encounter (Signed)
Do you want her to follow up after she gets the pelvic US in 6 weeks? Or will you call her with the results?   Thanks,  Marcelino DusterMichelle

## 2015-12-10 ENCOUNTER — Telehealth: Payer: Self-pay | Admitting: Radiology

## 2015-12-10 NOTE — Telephone Encounter (Signed)
Notified pt that ESWL schedule has been moved to the morning of 12/12/15. Advised pt her arrival time to [facility] is now 7:15. Pt voices understanding.

## 2015-12-11 ENCOUNTER — Encounter: Payer: Self-pay | Admitting: *Deleted

## 2015-12-12 ENCOUNTER — Encounter
Admission: RE | Disposition: A | Discharge status: Home / Self Care | Payer: Self-pay | Source: Ambulatory Visit | Attending: Urology

## 2015-12-12 ENCOUNTER — Ambulatory Visit
Admission: RE | Admit: 2015-12-12 | Discharge: 2015-12-12 | Disposition: A | Discharge status: Home / Self Care | Payer: 59 | Source: Ambulatory Visit | Attending: Urology | Admitting: Urology

## 2015-12-12 ENCOUNTER — Encounter: Payer: Self-pay | Admitting: *Deleted

## 2015-12-12 DIAGNOSIS — E785 Hyperlipidemia, unspecified: Secondary | ICD-10-CM | POA: Diagnosis not present

## 2015-12-12 DIAGNOSIS — I10 Essential (primary) hypertension: Secondary | ICD-10-CM | POA: Insufficient documentation

## 2015-12-12 DIAGNOSIS — N200 Calculus of kidney: Secondary | ICD-10-CM | POA: Insufficient documentation

## 2015-12-12 DIAGNOSIS — Z79899 Other long term (current) drug therapy: Secondary | ICD-10-CM | POA: Insufficient documentation

## 2015-12-12 DIAGNOSIS — Z888 Allergy status to other drugs, medicaments and biological substances status: Secondary | ICD-10-CM | POA: Insufficient documentation

## 2015-12-12 DIAGNOSIS — N83201 Unspecified ovarian cyst, right side: Secondary | ICD-10-CM | POA: Insufficient documentation

## 2015-12-12 DIAGNOSIS — Z7984 Long term (current) use of oral hypoglycemic drugs: Secondary | ICD-10-CM | POA: Diagnosis not present

## 2015-12-12 DIAGNOSIS — Z8249 Family history of ischemic heart disease and other diseases of the circulatory system: Secondary | ICD-10-CM | POA: Insufficient documentation

## 2015-12-12 DIAGNOSIS — E119 Type 2 diabetes mellitus without complications: Secondary | ICD-10-CM | POA: Diagnosis not present

## 2015-12-12 DIAGNOSIS — N83202 Unspecified ovarian cyst, left side: Secondary | ICD-10-CM | POA: Diagnosis not present

## 2015-12-12 DIAGNOSIS — Z833 Family history of diabetes mellitus: Secondary | ICD-10-CM | POA: Diagnosis not present

## 2015-12-12 DIAGNOSIS — R3129 Other microscopic hematuria: Secondary | ICD-10-CM | POA: Diagnosis not present

## 2015-12-12 DIAGNOSIS — Z9889 Other specified postprocedural states: Secondary | ICD-10-CM | POA: Diagnosis not present

## 2015-12-12 DIAGNOSIS — Z800 Family history of malignant neoplasm of digestive organs: Secondary | ICD-10-CM | POA: Diagnosis not present

## 2015-12-12 DIAGNOSIS — Z886 Allergy status to analgesic agent status: Secondary | ICD-10-CM | POA: Insufficient documentation

## 2015-12-12 DIAGNOSIS — Z6841 Body Mass Index (BMI) 40.0 and over, adult: Secondary | ICD-10-CM | POA: Diagnosis not present

## 2015-12-12 DIAGNOSIS — Z87440 Personal history of urinary (tract) infections: Secondary | ICD-10-CM | POA: Diagnosis not present

## 2015-12-12 HISTORY — PX: EXTRACORPOREAL SHOCK WAVE LITHOTRIPSY: SHX1557

## 2015-12-12 LAB — POCT PREGNANCY, URINE: Preg Test, Ur: NEGATIVE

## 2015-12-12 LAB — GLUCOSE, CAPILLARY: GLUCOSE-CAPILLARY: 154 mg/dL — AB (ref 65–99)

## 2015-12-12 SURGERY — LITHOTRIPSY, ESWL
Anesthesia: Moderate Sedation | Laterality: Left

## 2015-12-12 MED ORDER — DEXTROSE-NACL 5-0.45 % IV SOLN
INTRAVENOUS | Status: DC
  Administered 2015-12-12: 07:00:00 via INTRAVENOUS

## 2015-12-12 MED ORDER — DIAZEPAM 5 MG PO TABS
ORAL_TABLET | ORAL | Status: AC
  Administered 2015-12-12: 10 mg via ORAL
  Filled 2015-12-12: qty 2

## 2015-12-12 MED ORDER — TAMSULOSIN HCL 0.4 MG PO CAPS: 0.4000 mg | ORAL_CAPSULE | Freq: Every day | ORAL | Status: DC

## 2015-12-12 MED ORDER — DIAZEPAM 5 MG PO TABS
10.0000 mg | ORAL_TABLET | ORAL | Status: AC
  Administered 2015-12-12: 10 mg via ORAL

## 2015-12-12 MED ORDER — CIPROFLOXACIN HCL 500 MG PO TABS
ORAL_TABLET | ORAL | Status: AC
  Administered 2015-12-12: 500 mg via ORAL
  Filled 2015-12-12: qty 1

## 2015-12-12 MED ORDER — HYDROCODONE-ACETAMINOPHEN 5-325 MG PO TABS: 1.0000 | ORAL_TABLET | Freq: Four times a day (QID) | ORAL | Status: DC | PRN

## 2015-12-12 MED ORDER — DOCUSATE SODIUM 100 MG PO CAPS: 100.0000 mg | ORAL_CAPSULE | Freq: Two times a day (BID) | ORAL | Status: DC

## 2015-12-12 MED ORDER — CIPROFLOXACIN HCL 500 MG PO TABS
500.0000 mg | ORAL_TABLET | ORAL | Status: AC
  Administered 2015-12-12: 500 mg via ORAL

## 2015-12-12 MED ORDER — DIPHENHYDRAMINE HCL 25 MG PO CAPS
25.0000 mg | ORAL_CAPSULE | ORAL | Status: AC
  Administered 2015-12-12: 25 mg via ORAL

## 2015-12-12 MED ORDER — DIPHENHYDRAMINE HCL 25 MG PO CAPS
ORAL_CAPSULE | ORAL | Status: AC
  Administered 2015-12-12: 25 mg via ORAL
  Filled 2015-12-12: qty 1

## 2015-12-12 NOTE — H&P (View-Only) (Signed)
1:00 PM  12/06/2015  Joyce Rowe May 05, 1966 [id number]  Referring provider: Owens Loffler, MD [facility] [address] [place], [place] [address]  Chief Complaint  Patient presents with  . Cysto    HPI: 50 -year-old [place]  Female who returns today for completion of her microscopic hematuria workup. She presents today to the office for cystoscopy. She is rather anxious about this procedure.  She has had episodes of intermittent left flank pain over the past few weeks. She was treated recently for urinary tract infection as well.  CT urogram review today. The stent show a large 18 mm left UPJ stone without hydronephrosis.  She also has a smaller left lower pole stone as well.  She also has bilateral ovarian cysts. She has not yet menopausal.  No previous history of kidney stones.    PMH: Past Medical History  Diagnosis Date  . Unspecified essential hypertension   . Morbid obesity with BMI of 40.0-44.9, adult ([place]) 03/28/2014  . Diabetes mellitus type 2, uncontrolled, without complications ([place]) 01/24/8371  . Diabetes mellitus without complication ([place])   . Hyperlipidemia   . Microscopic hematuria     Surgical History: Past Surgical History  Procedure Laterality Date  . Cesarean section  08/2002  . Tubes in ears      70's    Home Medications:    Medication List       This list is accurate as of: 12/06/15  1:00 PM.  Always use your most recent med list.               Fish Oil 1000 MG Caps  Take 1 capsule by mouth daily.     fluconazole 150 MG tablet  Commonly known as:  DIFLUCAN  Take 1 tablet (150 mg total) by mouth once. May repeat in 1 week if needed     hydrochlorothiazide 25 MG tablet  Commonly known as:  HYDRODIURIL  Take 1 tablet by mouth  daily     lisinopril 20 MG tablet  Commonly known as:  PRINIVIL,ZESTRIL  Take 1 tablet (20 mg total) by mouth daily.     metFORMIN 500 MG 24 hr tablet  Commonly known as:   GLUCOPHAGE-XR  Take 1 tablet (500 mg total) by mouth daily with breakfast.     multivitamin capsule  Take 1 capsule by mouth daily.     ONE TOUCH ULTRA MINI w/Device Kit  CHECK BLOOD SUGAR TWICE A DAY AS DIRECTED     ONE TOUCH ULTRA TEST test strip  Generic drug:  glucose blood  Use to check blood sugar two times a day.  Dx:  B02.11     ONETOUCH DELICA LANCETS FINE Misc  CHECK BLOOD SUGAR TWICE A DAY AS DIRECTED     pravastatin 40 MG tablet  Commonly known as:  PRAVACHOL  Take 1 tablet by mouth  daily        Allergies:  Allergies  Allergen Reactions  . Metaxalone     REACTION: Rash  . Naproxen     REACTION: Rash  . Bextra [Valdecoxib] Rash    Family History: Family History  Problem Relation Age of Onset  . Colon cancer      Grandparents  <60  . Heart disease      Grandparents  . Hypertension Mother   . Hypertension Father   . Diabetes Maternal Grandmother   . Breast cancer Neg Hx   . Hematuria Father   .  Kidney disease Neg Hx   . Bladder Cancer Neg Hx     Social History:  reports that she has never smoked. She has never used smokeless tobacco. She reports that she does not drink alcohol or use illicit drugs.  Physical Exam: BP 177/115 mmHg  Pulse 112  Ht _0  (1.727 m)  Wt 274 lb 1.6 oz (124.331 kg)  BMI 41.69 kg/m2  LMP 10/25/2015  Constitutional: Well nourished. Alert and oriented, No acute distress. HEENT: [place] AT, moist mucus membranes. Trachea midline, no masses. Cardiovascular: No clubbing, cyanosis, or edema. Respiratory: Normal respiratory effort, no increased work of breathing. GI: Abdomen is soft, non tender, non distended, no abdominal masses. Liver and spleen not palpable.  No hernias appreciated.  Stool sample for occult testing is not indicated.   GU: Normal external genitalia. Normal urethral meatus. Skin: No rashes, bruises or suspicious lesions. Lymph: No cervical or inguinal adenopathy. Neurologic: Grossly intact, no focal deficits,  moving all 4 extremities. Psychiatric: Normal mood and affect.  Laboratory Data: Lab Results  Component Value Date   WBC 9.1 03/22/2015   HGB 16.2* 03/23/2014   HCT 43.7 03/22/2015   MCV 82 03/22/2015   PLT 265 03/22/2015    Lab Results  Component Value Date   CREATININE 0.73 11/12/2015    Lab Results  Component Value Date   HGBA1C 6.3* 08/15/2015    Lab Results  Component Value Date   TSH 0.903 03/22/2015       Component Value Date/Time   CHOL 208* 03/22/2015 1036   HDL 46 03/22/2015 1036   LDLCALC 88 03/22/2015 1036    Lab Results  Component Value Date   AST 26 03/22/2015   Lab Results  Component Value Date   ALT 29 03/22/2015    Urinalysis UA reviewed, no evidence of infection  Imaging Study Result     CLINICAL DATA: Recent UTI with micro hematuria.  EXAM: CT ABDOMEN AND PELVIS WITHOUT AND WITH CONTRAST  TECHNIQUE: Multidetector CT imaging of the abdomen and pelvis was performed following the standard protocol before and following the bolus administration of intravenous contrast.  CONTRAST: 125 cc Isovue 370  COMPARISON: None.  FINDINGS: Lower chest: Unremarkable.  Hepatobiliary: No focal abnormality within the liver parenchyma. There is no evidence for gallstones, gallbladder wall thickening, or pericholecystic fluid. No intrahepatic or extrahepatic biliary dilation.  Pancreas: No focal mass lesion. No dilatation of the main duct. No intraparenchymal cyst. No peripancreatic edema.  Spleen: No splenomegaly. No focal mass lesion.  Adrenals/Urinary Tract: No adrenal nodule or mass. Right kidney is unremarkable. 18 x 8 x 17 mm stone is identified in the left renal pelvis. For for 5 additional tiny stones are clustered in the left lower pole collecting system, ranging in size from 1-2 mm up to 4 mm. No ureteral or bladder stones.  Imaging after intravenous contrast administration shows no enhancing lesion in either  kidney. Delayed imaging shows good opacification of the intrarenal collecting systems bilaterally. Left renal stones are again noted, but otherwise no filling defect within the opacified intrarenal collecting system or renal pelvis of either kidney. Right ureter is well opacified and has normal imaging features. Proximal 1/3 of the left ureter is not opacified, but left ureter is otherwise unremarkable. Delayed imaging through the bladder shows no focal bladder wall abnormality.  Stomach/Bowel: Stomach is nondistended. No gastric wall thickening. No evidence of outlet obstruction. Small duodenum diverticulum noted. No small bowel wall thickening. No small bowel dilatation. The terminal  ileum is normal. The appendix is normal. No gross colonic mass. No colonic wall thickening. No substantial diverticular change.  Vascular/Lymphatic: There is no gastrohepatic or hepatoduodenal ligament lymphadenopathy. No intraperitoneal or retroperitoneal lymphadenopathy. Small lymph nodes are evident in the hepatoduodenal ligament. No pelvic sidewall lymphadenopathy.  Reproductive: Uterus is unremarkable. 3.7 x 5.7 x 4.6 cm cystic mass is identified in the left ovary. 4.2 x 7.8 x 8.4 cm cystic mass is identified in the right ovary.  Other: No intraperitoneal free fluid.  Musculoskeletal: Sclerotic changes are seen at the symphysis pubis and SI joints bilaterally. Bone windows reveal no worrisome lytic or sclerotic osseous lesions.  IMPRESSION: 1. 18 x 8 x 17 mm nonobstructing stone identified in the left renal pelvis with clustered tiny stones in the left lower pole collecting systems. Otherwise unremarkable CT evaluation of the urinary tract. 2. Bilateral cystic ovarian masses. Based on the size of these lesions, if the patient is premenopausal, follow-up ultrasound exam in 6-12 weeks is recommended to assess for resolution. If the patient is postmenopausal, ultrasound exam is recommended  at this time to further characterize. This recommendation follows ACR consensus guidelines: White Paper of the ACR Incidental Findings Committee II on Adnexal Findings. J Am Coll Radiol [phone].   Electronically Signed  By: Misty Stanley M.D.  On: 11/25/2015 12:06   CT scan was reviewed personally and with the patient   Cystoscopy Procedure Note  Patient identification was confirmed, informed consent was obtained, and patient was prepped using Betadine solution.  Lidocaine jelly was administered per urethral meatus.    Preoperative abx where received prior to procedure.    Procedure: - Flexible cystoscope introduced, without any difficulty.   - Thorough search of the bladder revealed:    normal urethral meatus    normal urothelium with mild trigonitis    no stones    no ulcers     no tumors    no urethral polyps    no trabeculation   Uterine impression noted posteriorly  - Ureteral orifices were normal in position and appearance.  Post-Procedure: - Patient tolerated the procedure well    Assessment & Plan:    1. Microscopic hematuria Ask me today negative. Ronalee Belts scopic hematuria likely related to kidney stone. - Urinalysis, Complete - ciprofloxacin (CIPRO) tablet 500 mg; Take 1 tablet (500 mg total) by mouth once. - lidocaine (XYLOCAINE) 2 % jelly 1 application; Place 1 application into the urethra once.  2. Calculus of left kidney Large 18 mm left UPJ stone with some smaller nonobstructing lower pole stones. She is occasionally symptomatic. Given the size of the stone, I have recommended further treatment.  We discussed various treatment options including ESWL (with and without stent) vs. ureteroscopy, laser lithotripsy, and stent vs PCNL. We discussed the risks and benefits of each including bleeding, infection, damage to surrounding structures, efficacy with need for possible further intervention, and need for temporary ureteral stent.  She is very  anxious about having surgical intervention and general anesthesia. She would like to attempt shockwave lithotripsy in order to avoid general anesthesia and invasive surgery. I explained in detail today that given the size of her stone, she is at fairly significant risk for developing Steinstrasse, ureteral obstruction, and high risk of needing multiple procedures including staged ESWL versus ureteroscopy down the road. She understands this and is willing to proceed spite the risk. She was offered a preoperative stent but declined.  3. Cysts of both ovaries Additional bilateral ovarian cysts. She is premenopausal.  Findings were reviewed with the patient. As such, recommend follow-up pelvic ultrasound in 6-12 weeks per radiology recommendations.   These notes generated with voice recognition software. I apologize for typographical errors.  Hollice Espy, MD  [facility] [address], [place], [place] [address] [phone]  [email or web address]

## 2015-12-12 NOTE — Interval H&P Note (Signed)
History and Physical Interval Note:  12/12/2015 7:53 AM  Joyce Rowe  has presented today for surgery, with the diagnosis of kidney stone  The various methods of treatment have been discussed with the patient and family. After consideration of risks, benefits and other options for treatment, the patient has consented to  Procedure(s): EXTRACORPOREAL SHOCK WAVE LITHOTRIPSY (ESWL) (Left) as a surgical intervention .  The patient's history has been reviewed, patient examined, no change in status, stable for surgery.  I have reviewed the patient's chart and labs.  Questions were answered to the patient's satisfaction.     Vanna ScotlandAshley Jacari Kirsten

## 2015-12-12 NOTE — Discharge Instructions (Addendum)
AMBULATORY SURGERY  DISCHARGE INSTRUCTIONS   1) The drugs that you were given will stay in your system until tomorrow so for the next 24 hours you should not:  A) Drive an automobile B) Make any legal decisions C) Drink any alcoholic beverage   2) You may resume regular meals tomorrow.  Today it is better to start with liquids and gradually work up to solid foods.  You may eat anything you prefer, but it is better to start with liquids, then soup and crackers, and gradually work up to solid foods.   3) Please notify your doctor immediately if you have any unusual bleeding, trouble breathing, redness and pain at the surgery site, drainage, fever, or pain not relieved by medication.    4) Additional Instructions: Force fluids. Avoid Caffiene   Please contact your physician with any problems or Same Day Surgery at [phone], Monday through Friday 6 am to 4 pm, or [place] at [facility] Main number at [phone].

## 2015-12-25 ENCOUNTER — Ambulatory Visit
Admission: RE | Admit: 2015-12-25 | Discharge: 2015-12-25 | Disposition: A | Discharge status: Home / Self Care | Payer: 59 | Source: Ambulatory Visit | Attending: Urology | Admitting: Urology

## 2015-12-25 DIAGNOSIS — N200 Calculus of kidney: Secondary | ICD-10-CM | POA: Diagnosis present

## 2015-12-26 ENCOUNTER — Encounter: Payer: Self-pay | Admitting: Urology

## 2015-12-26 ENCOUNTER — Ambulatory Visit (INDEPENDENT_AMBULATORY_CARE_PROVIDER_SITE_OTHER): Payer: 59 | Admitting: Urology

## 2015-12-26 VITALS — BP 141/86 | HR 118 | Ht 68.0 in | Wt 267.3 lb

## 2015-12-26 DIAGNOSIS — N83201 Unspecified ovarian cyst, right side: Secondary | ICD-10-CM | POA: Diagnosis not present

## 2015-12-26 DIAGNOSIS — N83202 Unspecified ovarian cyst, left side: Secondary | ICD-10-CM | POA: Diagnosis not present

## 2015-12-26 DIAGNOSIS — N200 Calculus of kidney: Secondary | ICD-10-CM

## 2015-12-26 DIAGNOSIS — R3129 Other microscopic hematuria: Secondary | ICD-10-CM

## 2015-12-26 LAB — URINALYSIS, COMPLETE
BILIRUBIN UA: NEGATIVE
GLUCOSE, UA: NEGATIVE
KETONES UA: NEGATIVE
Leukocytes, UA: NEGATIVE
Nitrite, UA: NEGATIVE
PH UA: 6 (ref 5.0–7.5)
PROTEIN UA: NEGATIVE
SPEC GRAV UA: 1.02 (ref 1.005–1.030)
Urobilinogen, Ur: 0.2 mg/dL (ref 0.2–1.0)

## 2015-12-26 LAB — MICROSCOPIC EXAMINATION
BACTERIA UA: NONE SEEN
WBC UA: NONE SEEN /HPF (ref 0–?)

## 2015-12-26 NOTE — Progress Notes (Signed)
9:Joyce Rowe 06-Dec-1965 [id number]  Referring provider: Owens Loffler, MD [facility] [address] [place], [place] [address]  Chief Complaint  Patient presents with  . Follow-up    2 week fu from ESWL KUB done    HPI: Patient is status post left ESWL for a large pelvic stone on 05/052017.  Background history Patient was referral from their PCP, Dr.Copeland, for microscopic hematuria.   CT Urogram completed on 11/25/2015 noted a large nonobstructing stone in the left renal pelvis with clustered tiny stones in the left lower pole collecting system. She was also known to have bilateral cystic ovarian masses.  She chose ESWL as definitive management for her left renal stone. She did not pursue placement of a ureteral stent prior to the procedure.  She states that she has not had any complications post procedurally. She brings in fragments that she has passed and collected.  She is not having urinary symptoms at this time. She is not having gross hematuria, dysuria, suprapubic pain or flank pain.  KUB taken on 12/25/2015 noted that the previous of the 5 left renal pelvic stone was no longer identified. The small left upper renal pole calyceal stones could not be excluded.  I have personally reviewed the films.  Her UA today was unremarkable.  PMH: Past Medical History  Diagnosis Date  . Unspecified essential hypertension   . Morbid obesity with BMI of 40.0-44.9, adult ([place]) 03/28/2014  . Diabetes mellitus type 2, uncontrolled, without complications ([place]) 7/86/7672  . Diabetes mellitus without complication ([place])   . Hyperlipidemia   . Microscopic hematuria   . Kidney stone     Surgical History: Past Surgical History  Procedure Laterality Date  . Cesarean section  08/2002  . Tubes in ears      70's  . Extracorporeal shock wave lithotripsy Left 12/12/2015    Procedure: EXTRACORPOREAL SHOCK WAVE LITHOTRIPSY (ESWL);  Surgeon: Hollice Espy, MD;   Location: [facility] ORS;  Service: Urology;  Laterality: Left;    Home Medications:    Medication List       This list is accurate as of: 12/26/15  9:57 AM.  Always use your most recent med list.               docusate sodium 100 MG capsule  Commonly known as:  COLACE  Take 1 capsule (100 mg total) by mouth 2 (two) times daily.     Fish Oil 1000 MG Caps  Take 1 capsule by mouth daily. Reported on 12/26/2015     fluconazole 150 MG tablet  Commonly known as:  DIFLUCAN  Take 1 tablet (150 mg total) by mouth once. May repeat in 1 week if needed     hydrochlorothiazide 25 MG tablet  Commonly known as:  HYDRODIURIL  Take 1 tablet by mouth  daily     HYDROcodone-acetaminophen 5-325 MG tablet  Commonly known as:  NORCO/VICODIN  Take 1-2 tablets by mouth every 6 (six) hours as needed for moderate pain.     lisinopril 20 MG tablet  Commonly known as:  PRINIVIL,ZESTRIL  Take 1 tablet (20 mg total) by mouth daily.     metFORMIN 500 MG 24 hr tablet  Commonly known as:  GLUCOPHAGE-XR  Take 1 tablet (500 mg total) by mouth daily with breakfast.     multivitamin capsule  Take 1 capsule by mouth daily. Reported on 12/26/2015     ONE TOUCH ULTRA  MINI w/Device Kit  CHECK BLOOD SUGAR TWICE A DAY AS DIRECTED     ONE TOUCH ULTRA TEST test strip  Generic drug:  glucose blood  Use to check blood sugar two times a day.  Dx:  J67.34     ONETOUCH DELICA LANCETS FINE Misc  CHECK BLOOD SUGAR TWICE A DAY AS DIRECTED     pravastatin 40 MG tablet  Commonly known as:  PRAVACHOL  Take 1 tablet by mouth  daily     tamsulosin 0.4 MG Caps capsule  Commonly known as:  FLOMAX  Take 1 capsule (0.4 mg total) by mouth daily.        Allergies:  Allergies  Allergen Reactions  . Naproxen     REACTION: Rash  . Bextra [Valdecoxib] Rash  . Metaxalone Rash    REACTION: Rash    Family History: Family History  Problem Relation Age of Onset  . Colon cancer      Grandparents  <60  . Heart disease       Grandparents  . Hypertension Mother   . Hypertension Father   . Diabetes Maternal Grandmother   . Breast cancer Neg Hx   . Hematuria Father   . Kidney disease Neg Hx   . Bladder Cancer Neg Hx     Social History:  reports that she has never smoked. She has never used smokeless tobacco. She reports that she does not drink alcohol or use illicit drugs.  ROS: UROLOGY Frequent Urination?: No Hard to postpone urination?: No Burning/pain with urination?: No Get up at night to urinate?: No Leakage of urine?: No Urine stream starts and stops?: No Trouble starting stream?: No Do you have to strain to urinate?: No Blood in urine?: No Urinary tract infection?: No Sexually transmitted disease?: No Injury to kidneys or bladder?: No Painful intercourse?: No Weak stream?: No Currently pregnant?: No Vaginal bleeding?: No Last menstrual period?: y  Gastrointestinal Nausea?: No Vomiting?: No Indigestion/heartburn?: No Diarrhea?: No Constipation?: No  Constitutional Fever: No Night sweats?: No Weight loss?: No Fatigue?: No  Skin Skin rash/lesions?: No Itching?: No  Eyes Blurred vision?: No Double vision?: No  Ears/Nose/Throat Sore throat?: No Sinus problems?: No  Hematologic/Lymphatic Swollen glands?: No Easy bruising?: No  Cardiovascular Leg swelling?: No Chest pain?: No  Respiratory Cough?: No Shortness of breath?: No  Endocrine Excessive thirst?: No  Musculoskeletal Back pain?: No Joint pain?: No  Neurological Headaches?: No Dizziness?: No  Psychologic Depression?: No Anxiety?: No  Physical Exam: BP 141/86 mmHg  Pulse 118  Ht 5' 8"  (1.727 m)  Wt 267 lb 4.8 oz (121.246 kg)  BMI 40.65 kg/m2  LMP 12/15/2015 (Exact Date)  Constitutional: Well nourished. Alert and oriented, No acute distress. HEENT: [place] AT, moist mucus membranes. Trachea midline, no masses. Cardiovascular: No clubbing, cyanosis, or edema. Respiratory: Normal respiratory  effort, no increased work of breathing. GI: Abdomen is soft, non tender, non distended, no abdominal masses. Liver and spleen not palpable.  No hernias appreciated.  Stool sample for occult testing is not indicated.   GU: No CVA tenderness.  No bladder fullness or masses.   Skin: No rashes, bruises or suspicious lesions. Lymph: No cervical or inguinal adenopathy. Neurologic: Grossly intact, no focal deficits, moving all 4 extremities. Psychiatric: Normal mood and affect.  Laboratory Data: Lab Results  Component Value Date   WBC 9.1 03/22/2015   HGB 16.2* 03/23/2014   HCT 43.7 03/22/2015   MCV 82 03/22/2015   PLT 265 03/22/2015  Lab Results  Component Value Date   CREATININE 0.73 11/12/2015    Lab Results  Component Value Date   HGBA1C 6.3* 08/15/2015    Lab Results  Component Value Date   TSH 0.903 03/22/2015       Component Value Date/Time   CHOL 208* 03/22/2015 1036   HDL 46 03/22/2015 1036   LDLCALC 88 03/22/2015 1036    Lab Results  Component Value Date   AST 26 03/22/2015   Lab Results  Component Value Date   ALT 29 03/22/2015    Urinalysis Results for orders placed or performed in visit on 12/26/15  Microscopic Examination  Result Value Ref Range   WBC, UA None seen 0 -  5 /hpf   RBC, UA 0-2 0 -  2 /hpf   Epithelial Cells (non renal) 0-10 0 - 10 /hpf   Bacteria, UA None seen None seen/Few  Urinalysis, Complete  Result Value Ref Range   Specific Gravity, UA 1.020 1.005 - 1.030   pH, UA 6.0 5.0 - 7.5   Color, UA Yellow Yellow   Appearance Ur Clear Clear   Leukocytes, UA Negative Negative   Protein, UA Negative Negative/Trace   Glucose, UA Negative Negative   Ketones, UA Negative Negative   RBC, UA Trace (A) Negative   Bilirubin, UA Negative Negative   Urobilinogen, Ur 0.2 0.2 - 1.0 mg/dL   Nitrite, UA Negative Negative   Microscopic Examination See below:      Assessment & Plan:    1. Microscopic hematuria:  Patient has completed her  hematuria workup a CT urogram and cystoscopically. She was found to have a left renal pelvic stone and lower pole renal stones. She underwent ESWL for the left pelvic stone. Renal ultrasound will be scheduled in one month. Her UA today was unremarkable.   - Urinalysis, Complete  2. Left renal stones:   Patient underwent definitive treatment for her left pelvic renal stone. She still has remaining left lower pole stones. The stone fragments are sent for analysis. She will be undergoing renal ultrasound in the future.  She will be returning for that report.  3. Bilateral ovarian cysts:  Patient will be undergoing pelvic ultrasound for further evaluation of these ovarian cysts.     Return in about 1 month (around 01/26/2016) for RUS and pelvic US report.  These notes generated with voice recognition software. I apologize for typographical errors.  Zara Council, [facility] [address] [place], [place] [address] [phone]

## 2015-12-27 ENCOUNTER — Encounter: Payer: Self-pay | Admitting: Urology

## 2015-12-28 DIAGNOSIS — N200 Calculus of kidney: Secondary | ICD-10-CM | POA: Insufficient documentation

## 2015-12-28 DIAGNOSIS — N83202 Unspecified ovarian cyst, left side: Secondary | ICD-10-CM

## 2015-12-28 DIAGNOSIS — N83201 Unspecified ovarian cyst, right side: Secondary | ICD-10-CM | POA: Insufficient documentation

## 2015-12-31 ENCOUNTER — Ambulatory Visit
Admission: RE | Admit: 2015-12-31 | Discharge: 2015-12-31 | Disposition: A | Discharge status: Home / Self Care | Payer: 59 | Source: Ambulatory Visit | Attending: Urology | Admitting: Urology

## 2015-12-31 ENCOUNTER — Telehealth: Payer: Self-pay | Admitting: Urology

## 2015-12-31 DIAGNOSIS — N200 Calculus of kidney: Secondary | ICD-10-CM | POA: Diagnosis present

## 2015-12-31 DIAGNOSIS — N83209 Unspecified ovarian cyst, unspecified side: Secondary | ICD-10-CM

## 2015-12-31 NOTE — Telephone Encounter (Signed)
Patient called she had her RUS today but they could not do her Pelvic because the order is in wrong. Can you get this fixed and let me know sot hat i can get her scheduled before her follow up appt.  Thanks  [employer] FinancialMichelle

## 2016-01-01 NOTE — Telephone Encounter (Signed)
Correct orders have been placed.

## 2016-01-02 ENCOUNTER — Other Ambulatory Visit: Payer: Self-pay | Admitting: Urology

## 2016-01-08 ENCOUNTER — Ambulatory Visit
Admission: RE | Admit: 2016-01-08 | Discharge: 2016-01-08 | Disposition: A | Discharge status: Home / Self Care | Payer: 59 | Source: Ambulatory Visit | Attending: Urology | Admitting: Urology

## 2016-01-08 DIAGNOSIS — N83202 Unspecified ovarian cyst, left side: Secondary | ICD-10-CM | POA: Diagnosis present

## 2016-01-08 DIAGNOSIS — N83209 Unspecified ovarian cyst, unspecified side: Secondary | ICD-10-CM

## 2016-01-08 DIAGNOSIS — N83201 Unspecified ovarian cyst, right side: Secondary | ICD-10-CM | POA: Diagnosis present

## 2016-01-09 ENCOUNTER — Telehealth: Payer: Self-pay | Admitting: Urology

## 2016-01-09 DIAGNOSIS — N83202 Unspecified ovarian cyst, left side: Principal | ICD-10-CM

## 2016-01-09 DIAGNOSIS — N83201 Unspecified ovarian cyst, right side: Secondary | ICD-10-CM

## 2016-01-09 NOTE — Telephone Encounter (Signed)
Reviewed pelvic ultrasound report with the patient. At this point, I do feel that its wife that she establish care with an OB/GYN. She does not currently have a gynecologist.  I have recommended referral to [facility]. I will place an order and help facilitate making this appropriate follow up.  Vanna ScotlandAshley Jett Fukuda, MD

## 2016-01-14 ENCOUNTER — Other Ambulatory Visit: Payer: Self-pay | Admitting: Family Medicine

## 2016-01-14 NOTE — Telephone Encounter (Signed)
She has an appt on 02-19-16   Thanks, Marcelino DusterMichelle

## 2016-01-27 ENCOUNTER — Ambulatory Visit (INDEPENDENT_AMBULATORY_CARE_PROVIDER_SITE_OTHER): Payer: 59 | Admitting: Urology

## 2016-01-27 ENCOUNTER — Encounter: Payer: Self-pay | Admitting: Urology

## 2016-01-27 VITALS — BP 138/82 | HR 111 | Ht 68.0 in | Wt 267.5 lb

## 2016-01-27 DIAGNOSIS — R3129 Other microscopic hematuria: Secondary | ICD-10-CM | POA: Diagnosis not present

## 2016-01-27 DIAGNOSIS — N839 Noninflammatory disorder of ovary, fallopian tube and broad ligament, unspecified: Secondary | ICD-10-CM | POA: Diagnosis not present

## 2016-01-27 DIAGNOSIS — N200 Calculus of kidney: Secondary | ICD-10-CM

## 2016-01-27 DIAGNOSIS — N838 Other noninflammatory disorders of ovary, fallopian tube and broad ligament: Secondary | ICD-10-CM

## 2016-01-27 LAB — URINALYSIS, COMPLETE
Bilirubin, UA: NEGATIVE
Glucose, UA: NEGATIVE
KETONES UA: NEGATIVE
Leukocytes, UA: NEGATIVE
NITRITE UA: NEGATIVE
PH UA: 7 (ref 5.0–7.5)
Protein, UA: NEGATIVE
RBC, UA: NEGATIVE
SPEC GRAV UA: 1.02 (ref 1.005–1.030)
Urobilinogen, Ur: 0.2 mg/dL (ref 0.2–1.0)

## 2016-01-27 LAB — MICROSCOPIC EXAMINATION: Bacteria, UA: NONE SEEN

## 2016-01-27 NOTE — Progress Notes (Signed)
3:02 PM   Joyce Rowe Feb 11, 1966 [id number]  Referring provider: Owens Loffler, MD [place] [address] [place], [place] [address]  Chief Complaint  Patient presents with  . Results    RUS    HPI: Patient is a 50 year old Caucasian female who presents today to discuss her renal ultrasound results.  Background history Patient is status post left ESWL for a large pelvic stone on 05/052017.  Patient was referral from their PCP, Dr.Copeland, for microscopic hematuria.   CT Urogram completed on 11/25/2015 noted a large nonobstructing stone in the left renal pelvis with clustered tiny stones in the left lower pole collecting system. She chose ESWL as definitive management for her left renal stone. She did not pursue placement of a ureteral stent prior to the procedure.  She states that she has not had any complications post procedurally. She brought in fragments that she has passed and collected.  KUB taken on 12/25/2015 noted that the previous identified left renal pelvic stone was no longer identified. The small left upper renal pole calyceal stones could not be excluded.    Today, she is not experiencing any flank pain or hematuria.  Her UA today was unremarkable.  Renal ultrasound completed on 12/31/2015 noted no hydronephrosis or kidney stones.  I personally reviewed the films with the patient.  I did remind her of the tiny stones in her left lower pole collecting system may still be present that were seen on the CT scan.  Her stone analysis noted a composition of 95% uric acid and 5% ammonia urate stone.  She was also known to have bilateral cystic ovarian masses.  Patient has been seen and evaluated by gynecology for her ovarian masses.  Her UA today was unremarkable.  PMH: Past Medical History  Diagnosis Date  . Unspecified essential hypertension   . Morbid obesity with BMI of 40.0-44.9, adult ([place]) 03/28/2014  . Diabetes mellitus type 2,  uncontrolled, without complications ([place]) 11/16/1446  . Diabetes mellitus without complication ([place])   . Hyperlipidemia   . Microscopic hematuria   . Kidney stone     Surgical History: Past Surgical History  Procedure Laterality Date  . Cesarean section  08/2002  . Tubes in ears      70's  . Extracorporeal shock wave lithotripsy Left 12/12/2015    Procedure: EXTRACORPOREAL SHOCK WAVE LITHOTRIPSY (ESWL);  Surgeon: Hollice Espy, MD;  Location: [facility] ORS;  Service: Urology;  Laterality: Left;    Home Medications:    Medication List       This list is accurate as of: 01/27/16  3:02 PM.  Always use your most recent med list.               docusate sodium 100 MG capsule  Commonly known as:  COLACE  Take 1 capsule (100 mg total) by mouth 2 (two) times daily.     Fish Oil 1000 MG Caps  Take 1 capsule by mouth daily. Reported on 01/27/2016     fluconazole 150 MG tablet  Commonly known as:  DIFLUCAN  Take 1 tablet (150 mg total) by mouth once. May repeat in 1 week if needed     hydrochlorothiazide 25 MG tablet  Commonly known as:  HYDRODIURIL  Take 1 tablet by mouth  daily     HYDROcodone-acetaminophen 5-325 MG tablet  Commonly known as:  NORCO/VICODIN  Take 1-2 tablets by mouth every 6 (six) hours as needed for moderate pain.  lisinopril 20 MG tablet  Commonly known as:  PRINIVIL,ZESTRIL  Take 1 tablet (20 mg total) by mouth daily.     metFORMIN 500 MG 24 hr tablet  Commonly known as:  GLUCOPHAGE-XR  Take 1 tablet (500 mg total) by mouth daily with breakfast.     multivitamin capsule  Take 1 capsule by mouth daily. Reported on 12/26/2015     ONE TOUCH ULTRA MINI w/Device Kit  CHECK BLOOD SUGAR TWICE A DAY AS DIRECTED     ONE TOUCH ULTRA TEST test strip  Generic drug:  glucose blood  Use to check blood sugar two times a day.  Dx:  O24.23     ONETOUCH DELICA LANCETS FINE Misc  CHECK BLOOD SUGAR TWICE A DAY AS DIRECTED     pravastatin 40 MG tablet  Commonly known  as:  PRAVACHOL  Take 1 tablet by mouth  daily     tamsulosin 0.4 MG Caps capsule  Commonly known as:  FLOMAX  Take 1 capsule (0.4 mg total) by mouth daily.        Allergies:  Allergies  Allergen Reactions  . Naproxen     REACTION: Rash  . Bextra [Valdecoxib] Rash  . Metaxalone Rash    REACTION: Rash    Family History: Family History  Problem Relation Age of Onset  . Colon cancer      Grandparents  <60  . Heart disease      Grandparents  . Hypertension Mother   . Hypertension Father   . Diabetes Maternal Grandmother   . Breast cancer Neg Hx   . Hematuria Father   . Kidney disease Neg Hx   . Bladder Cancer Neg Hx     Social History:  reports that she has never smoked. She has never used smokeless tobacco. She reports that she does not drink alcohol or use illicit drugs.  ROS: UROLOGY Frequent Urination?: No Hard to postpone urination?: No Burning/pain with urination?: No Get up at night to urinate?: No Leakage of urine?: No Urine stream starts and stops?: No Trouble starting stream?: No Do you have to strain to urinate?: No Blood in urine?: No Urinary tract infection?: No Sexually transmitted disease?: No Injury to kidneys or bladder?: No Painful intercourse?: No Weak stream?: No Currently pregnant?: No Vaginal bleeding?: No Last menstrual period?: [id number]  Gastrointestinal Nausea?: No Vomiting?: No Indigestion/heartburn?: No Diarrhea?: No Constipation?: No  Constitutional Fever: No Night sweats?: No Weight loss?: No Fatigue?: No  Skin Skin rash/lesions?: No Itching?: No  Eyes Blurred vision?: No Double vision?: No  Ears/Nose/Throat Sore throat?: No Sinus problems?: No  Hematologic/Lymphatic Swollen glands?: No Easy bruising?: No  Cardiovascular Leg swelling?: No Chest pain?: No  Respiratory Cough?: No Shortness of breath?: No  Endocrine Excessive thirst?: No  Musculoskeletal Back pain?: No Joint pain?:  No  Neurological Headaches?: No Dizziness?: No  Psychologic Depression?: No Anxiety?: No  Physical Exam: BP 138/82 mmHg  Pulse 111  Ht 5' 8"  (1.727 m)  Wt 267 lb 8 oz (121.337 kg)  BMI 40.68 kg/m2  LMP 12/12/2015  Constitutional: Well nourished. Alert and oriented, No acute distress. HEENT: [place] AT, moist mucus membranes. Trachea midline, no masses. Cardiovascular: No clubbing, cyanosis, or edema. Respiratory: Normal respiratory effort, no increased work of breathing. Skin: No rashes, bruises or suspicious lesions. Lymph: No cervical or inguinal adenopathy. Neurologic: Grossly intact, no focal deficits, moving all 4 extremities. Psychiatric: Normal mood and affect.  Laboratory Data: Lab Results  Component Value Date  WBC 9.1 03/22/2015   HGB 16.2* 03/23/2014   HCT 43.7 03/22/2015   MCV 82 03/22/2015   PLT 265 03/22/2015    Lab Results  Component Value Date   CREATININE 0.73 11/12/2015    Lab Results  Component Value Date   HGBA1C 6.3* 08/15/2015    Lab Results  Component Value Date   TSH 0.903 03/22/2015       Component Value Date/Time   CHOL 208* 03/22/2015 1036   HDL 46 03/22/2015 1036   LDLCALC 88 03/22/2015 1036    Lab Results  Component Value Date   AST 26 03/22/2015   Lab Results  Component Value Date   ALT 29 03/22/2015    Urinalysis Results for orders placed or performed in visit on 01/27/16  Microscopic Examination  Result Value Ref Range   WBC, UA 0-5 0 -  5 /hpf   RBC, UA 0-2 0 -  2 /hpf   Epithelial Cells (non renal) 0-10 0 - 10 /hpf   Bacteria, UA None seen None seen/Few  Urinalysis, Complete  Result Value Ref Range   Specific Gravity, UA 1.020 1.005 - 1.030   pH, UA 7.0 5.0 - 7.5   Color, UA Yellow Yellow   Appearance Ur Clear Clear   Leukocytes, UA Negative Negative   Protein, UA Negative Negative/Trace   Glucose, UA Negative Negative   Ketones, UA Negative Negative   RBC, UA Negative Negative   Bilirubin, UA  Negative Negative   Urobilinogen, Ur 0.2 0.2 - 1.0 mg/dL   Nitrite, UA Negative Negative   Microscopic Examination See below:    Pertinent imaging CLINICAL DATA: Follow-up of left kidney stones seen on an abdominal CT of November 25, 2015.  EXAM: RENAL / URINARY TRACT ULTRASOUND COMPLETE  COMPARISON: KUB of Dec 25, 2015 and abdominal CT scan of November 25, 2015  FINDINGS: Right Kidney:  Length: 12.1 cm. Echogenicity within normal limits. No mass or hydronephrosis visualized. No stones are observed.  Left Kidney:  Length: 12.6 cm. Echogenicity within normal limits. No mass or hydronephrosis visualized. No stones are observed.  Bladder:  Appears normal for degree of bladder distention. Bilateral ureteral jets are observed.  IMPRESSION: No kidney stones are demonstrated. There is no hydronephrosis.   Electronically Signed  By: David Martinique M.D.  On: 12/31/2015 16:41 Assessment & Plan:    1. Microscopic hematuria:  Patient has completed her hematuria workup a CT urogram and cystoscopy. She was found to have a left renal pelvic stone and lower pole renal stones. She underwent ESWL for the left pelvic stone.  Her UA today was unremarkable.  She return in 1 year for UA.  - Urinalysis, Complete  2. Left renal stones:   I explained to the patient that uric acid stones may be the result of a high purine diet.  I have encouraged her to drink 2.5 L of water daily.  I advised her to limit her intake of seafood, poultry, meats, cheese and acid.  She needs to increase her intake of fruits and vegetables and supplement with milk and yogurt.  She could starts potassium citrate at this time, but she would like to approach this with dietary measures.  Postprocedural ultrasound noted no renal trauma or iatrogenic hydronephrosis.  She will return in 1 year for KUB.   3. Bilateral ovarian masses:  Patient is being followed by gynecology for her ovarian masses.   Return in  about 1 year (around 01/26/2017) for KUB and UA.  These notes generated with voice recognition software. I apologize for typographical errors.  Zara Council, [facility] [address], [place], [place] [address] [phone]

## 2016-01-28 ENCOUNTER — Encounter: Payer: Self-pay | Admitting: Family Medicine

## 2016-01-31 NOTE — Telephone Encounter (Signed)
Pt called stating she has cpx 7/3 and wants her labs sent to [facility] on [address] Please call pt when this has been done Best number  [phone]

## 2016-02-02 ENCOUNTER — Encounter: Payer: Self-pay | Admitting: Family Medicine

## 2016-02-02 NOTE — Telephone Encounter (Signed)
Can you help Joyce Rowe set these up so that she can have them drawn at a [facility] draw station through the computer prior to her CPX? She is a [employer] employee.    FLP, E78.5 A1c, E11.9 Urine microalbumin, E11.9 Cbc with diff, HFP, BMET: Z79.899 TSH: R53.83 Uric acid: N20.2   Thanks. Electronically Signed  By: Hannah BeatSpencer Kiyanna Biegler, MD On: 02/02/2016 9:06 AM

## 2016-02-05 ENCOUNTER — Other Ambulatory Visit: Payer: Self-pay | Admitting: Family Medicine

## 2016-02-05 DIAGNOSIS — R5383 Other fatigue: Secondary | ICD-10-CM

## 2016-02-05 DIAGNOSIS — Z79899 Other long term (current) drug therapy: Secondary | ICD-10-CM

## 2016-02-05 DIAGNOSIS — E785 Hyperlipidemia, unspecified: Secondary | ICD-10-CM

## 2016-02-05 DIAGNOSIS — E119 Type 2 diabetes mellitus without complications: Secondary | ICD-10-CM

## 2016-02-05 DIAGNOSIS — N200 Calculus of kidney: Secondary | ICD-10-CM

## 2016-02-07 ENCOUNTER — Other Ambulatory Visit: Payer: Self-pay | Admitting: Urology

## 2016-02-07 LAB — BASIC METABOLIC PANEL
BUN/Creatinine Ratio: 20 (ref 9–23)
BUN: 16 mg/dL (ref 6–24)
CALCIUM: 9.9 mg/dL (ref 8.7–10.2)
CHLORIDE: 96 mmol/L (ref 96–106)
CO2: 25 mmol/L (ref 18–29)
Creatinine, Ser: 0.79 mg/dL (ref 0.57–1.00)
GFR calc non Af Amer: 89 mL/min/{1.73_m2} (ref >59)
GFR, EST AFRICAN AMERICAN: 102 mL/min/{1.73_m2} (ref >59)
GLUCOSE: 140 mg/dL — AB (ref 65–99)
POTASSIUM: 4.5 mmol/L (ref 3.5–5.2)
Sodium: 139 mmol/L (ref 134–144)

## 2016-02-07 LAB — CBC WITH DIFFERENTIAL/PLATELET
Basophils Absolute: 0 10*3/uL (ref 0.0–0.2)
Basos: 0 %
EOS (ABSOLUTE): 0.3 10*3/uL (ref 0.0–0.4)
Eos: 3 %
Hematocrit: 43.6 % (ref 34.0–46.6)
Hemoglobin: 15.2 g/dL (ref 11.1–15.9)
Immature Grans (Abs): 0 10*3/uL (ref 0.0–0.1)
Immature Granulocytes: 0 %
LYMPHS: 40 %
Lymphocytes Absolute: 3.4 10*3/uL — ABNORMAL HIGH (ref 0.7–3.1)
MCH: 28.7 pg (ref 26.6–33.0)
MCHC: 34.9 g/dL (ref 31.5–35.7)
MCV: 82 fL (ref 79–97)
MONOS ABS: 0.7 10*3/uL (ref 0.1–0.9)
Monocytes: 9 %
NEUTROS ABS: 4 10*3/uL (ref 1.4–7.0)
Neutrophils: 48 %
PLATELETS: 306 10*3/uL (ref 150–379)
RBC: 5.29 x10E6/uL — ABNORMAL HIGH (ref 3.77–5.28)
RDW: 14 % (ref 12.3–15.4)
WBC: 8.4 10*3/uL (ref 3.4–10.8)

## 2016-02-07 LAB — MICROALBUMIN / CREATININE URINE RATIO
Creatinine, Urine: 55.3 mg/dL
MICROALB/CREAT RATIO: <5.4 mg/g creat (ref 0.0–30.0)
Microalbumin, Urine: <3 ug/mL

## 2016-02-07 LAB — URIC ACID: Uric Acid: 5.7 mg/dL (ref 2.5–7.1)

## 2016-02-07 LAB — HEPATIC FUNCTION PANEL
ALT: 20 IU/L (ref 0–32)
AST: 19 IU/L (ref 0–40)
Albumin: 4.4 g/dL (ref 3.5–5.5)
Alkaline Phosphatase: 58 IU/L (ref 39–117)
Bilirubin Total: 0.5 mg/dL (ref 0.0–1.2)
Bilirubin, Direct: 0.13 mg/dL (ref 0.00–0.40)
Total Protein: 7.1 g/dL (ref 6.0–8.5)

## 2016-02-07 LAB — HEMOGLOBIN A1C
Est. average glucose Bld gHb Est-mCnc: 128 mg/dL
HEMOGLOBIN A1C: 6.1 % — AB (ref 4.8–5.6)

## 2016-02-07 LAB — LIPID PANEL
CHOL/HDL RATIO: 4.2 ratio (ref 0.0–4.4)
Cholesterol, Total: 221 mg/dL — ABNORMAL HIGH (ref 100–199)
HDL: 53 mg/dL (ref >39)
LDL CALC: 116 mg/dL — AB (ref 0–99)
Triglycerides: 260 mg/dL — ABNORMAL HIGH (ref 0–149)
VLDL CHOLESTEROL CAL: 52 mg/dL — AB (ref 5–40)

## 2016-02-07 LAB — TSH: TSH: 1.48 u[IU]/mL (ref 0.450–4.500)

## 2016-02-10 ENCOUNTER — Encounter: Payer: Self-pay | Admitting: Family Medicine

## 2016-02-10 ENCOUNTER — Ambulatory Visit (INDEPENDENT_AMBULATORY_CARE_PROVIDER_SITE_OTHER): Payer: 59 | Admitting: Family Medicine

## 2016-02-10 VITALS — BP 151/107 | HR 109 | Temp 98.6°F | Ht 67.5 in | Wt 267.5 lb

## 2016-02-10 DIAGNOSIS — IMO0001 Reserved for inherently not codable concepts without codable children: Secondary | ICD-10-CM

## 2016-02-10 DIAGNOSIS — E1165 Type 2 diabetes mellitus with hyperglycemia: Secondary | ICD-10-CM

## 2016-02-10 DIAGNOSIS — Z0000 Encounter for general adult medical examination without abnormal findings: Secondary | ICD-10-CM

## 2016-02-10 DIAGNOSIS — E785 Hyperlipidemia, unspecified: Secondary | ICD-10-CM

## 2016-02-10 MED ORDER — ROSUVASTATIN CALCIUM 40 MG PO TABS: 40.0000 mg | ORAL_TABLET | Freq: Every day | ORAL | Status: DC

## 2016-02-10 NOTE — Progress Notes (Signed)
Dr. Frederico Hamman T. Anh Mangano, MD, [facility] [place], [address] Phone: [phone] Fax: [phone]  02/10/2016  Patient: Joyce Rowe, MRN: [id number], DOB: 24-Feb-1966, 50 y.o.  Primary Physician:  Owens Loffler, MD   Chief Complaint  Patient presents with  . Annual Exam   Subjective:   Joyce Rowe is a 50 y.o. pleasant patient who presents with the following:  Health Maintenance Summary Reviewed and updated, unless pt declines services.  Tobacco History Reviewed. Non-smoker Alcohol: No concerns, no excessive use Exercise Habits: Every day! STD concerns: none Drug Use: None Menses regular: yes Lumps or breast concerns: no Breast Cancer Family History: no  Health Maintenance  Topic Date Due  . HIV Screening  01/05/1983  . INFLUENZA VACCINE  03/10/2016  . MAMMOGRAM  04/18/2016  . HEMOGLOBIN A1C  08/07/2016  . FOOT EXAM  02/09/2017  . OPHTHALMOLOGY EXAM  02/09/2017  . PAP SMEAR  03/28/2017  . PNEUMOCOCCAL POLYSACCHARIDE VACCINE (2) 05/12/2020  . TETANUS/TDAP  03/28/2024    Immunization History  Administered Date(s) Administered  . Influenza,inj,Quad PF,36+ Mos 05/13/2015  . Pneumococcal Polysaccharide-23 05/13/2015  . Tdap 03/28/2014   Diabetes Mellitus: Tolerating Medications: yes Compliance with diet: fair Exercise: minimal / intermittent Avg blood sugars at home: 100-140 Foot problems: none Hypoglycemia: none No nausea, vomitting, blurred vision, polyuria.  Lab Results  Component Value Date   HGBA1C 6.1* 02/06/2016   HGBA1C 6.3* 08/15/2015   HGBA1C 7.1* 05/06/2015   Lab Results  Component Value Date   LDLCALC 116* 02/06/2016   CREATININE 0.79 02/06/2016    Wt Readings from Last 3 Encounters:  02/10/16 267 lb 8 oz (121.337 kg)  01/27/16 267 lb 8 oz (121.337 kg)  12/26/15 267 lb 4.8 oz (121.246 kg)    Body mass index is 41.25 kg/(m^2).   Lipids:Unstable. Tolerating meds fine with  no SE. Panel reviewed with patient.  Lipids:    Component Value Date/Time   CHOL 221* 02/06/2016 0712   TRIG 260* 02/06/2016 0712   HDL 53 02/06/2016 0712   CHOLHDL 4.2 02/06/2016 0712    Lab Results  Component Value Date   ALT 20 02/06/2016   AST 19 02/06/2016   ALKPHOS 58 02/06/2016   BILITOT 0.5 02/06/2016     Patient Active Problem List   Diagnosis Date Noted  . Kidney stones 12/28/2015  . Cysts of both ovaries 12/28/2015  . Microscopic hematuria 11/12/2015  . Essential hypertension 08/21/2015  . Diabetes mellitus type 2, uncontrolled, without complications ([place]) 65/99/3570  . Hyperlipidemia LDL goal <70 03/28/2014  . Morbid obesity with BMI of 40.0-44.9, adult ([place]) 03/28/2014   Past Medical History  Diagnosis Date  . Unspecified essential hypertension   . Morbid obesity with BMI of 40.0-44.9, adult ([place]) 03/28/2014  . Diabetes mellitus type 2, uncontrolled, without complications ([place]) 1/77/9390  . Diabetes mellitus without complication ([place])   . Hyperlipidemia   . Microscopic hematuria   . Kidney stone    Past Surgical History  Procedure Laterality Date  . Cesarean section  08/2002  . Tubes in ears      70's  . Extracorporeal shock wave lithotripsy Left 12/12/2015    Procedure: EXTRACORPOREAL SHOCK WAVE LITHOTRIPSY (ESWL);  Surgeon: Hollice Espy, MD;  Location: [facility] ORS;  Service: Urology;  Laterality: Left;   Social History   Social History  . Marital Status: Married    Spouse Name: N/A  . Number of Children: 1  .  Years of Education: N/A   Occupational History  . BS, SAHM    Social History Main Topics  . Smoking status: Never Smoker   . Smokeless tobacco: Never Used  . Alcohol Use: No  . Drug Use: No  . Sexual Activity: Not on file   Other Topics Concern  . Not on file   Social History Narrative   Married      1 child      BS, SAHM      6-8 hours sleep per night      3 people living in home      Regular exercise-yes   Family  History  Problem Relation Age of Onset  . Colon cancer      Grandparents  <60  . Heart disease      Grandparents  . Hypertension Mother   . Hypertension Father   . Diabetes Maternal Grandmother   . Breast cancer Neg Hx   . Hematuria Father   . Kidney disease Neg Hx   . Bladder Cancer Neg Hx    Allergies  Allergen Reactions  . Naproxen     REACTION: Rash  . Bextra [Valdecoxib] Rash  . Metaxalone Rash    REACTION: Rash    Medication list has been reviewed and updated.   General: Denies fever, chills, sweats. No significant weight loss. Eyes: Denies blurring,significant itching ENT: Denies earache, sore throat, and hoarseness.  Cardiovascular: Denies chest pains, palpitations, dyspnea on exertion,  Respiratory: Denies cough, dyspnea at rest,wheeezing Breast: no concerns about lumps GI: Denies nausea, vomiting, diarrhea, constipation, change in bowel habits, abdominal pain, melena, hematochezia GU: Denies dysuria, hematuria, urinary hesitancy, nocturia, denies STD risk, no concerns about discharge UPCOMING OVARIAN SURGERY Musculoskeletal: Denies back pain, joint pain Derm: Denies rash, itching Neuro: Denies  paresthesias, frequent falls, frequent headaches Psych: Denies depression, anxiety Endocrine: Denies cold intolerance, heat intolerance, polydipsia Heme: Denies enlarged lymph nodes Allergy: No hayfever  Objective:   BP 151/107 mmHg  Pulse 109  Temp(Src) 98.6 F (37 C) (Oral)  Ht 5' 7.5" (1.715 m)  Wt 267 lb 8 oz (121.337 kg)  BMI 41.25 kg/m2  LMP 01/12/2016 No exam data present  GEN: well developed, well nourished, no acute distress Eyes: conjunctiva and lids normal, PERRLA, EOMI ENT: TM clear, nares clear, oral exam WNL Neck: supple, no lymphadenopathy, no thyromegaly, no JVD Pulm: clear to auscultation and percussion, respiratory effort normal CV: regular rate and rhythm, S1-S2, no murmur, rub or gallop, no bruits Chest: no scars, masses, no  lumps BREAST: breast exam normal. Chaperoned examination. Entirety of breast examined including axilla, and no enlarged lymph nodes. No significant masses are felt. No nipple discharge. No skin changes. Overall, reassuring breast exam.  This portion of the physical examination was chaperoned by Hedy Camara, CMA.  GI: soft, non-tender; no hepatosplenomegaly, masses; active bowel sounds all quadrants GU: GU exam declined Lymph: no cervical, axillary or inguinal adenopathy MSK: gait normal, muscle tone and strength WNL, no joint swelling, effusions, discoloration, crepitus  SKIN: clear, good turgor, color WNL, no rashes, lesions, or ulcerations Neuro: normal mental status, normal strength, sensation, and motion Psych: alert; oriented to person, place and time, normally interactive and not anxious or depressed in appearance.   All labs reviewed with patient. Lipids:    Component Value Date/Time   CHOL 221* 02/06/2016 0712   TRIG 260* 02/06/2016 0712   HDL 53 02/06/2016 0712   CHOLHDL 4.2 02/06/2016 0712   CBC: CBC Latest Ref  Rng 02/06/2016 03/22/2015 03/23/2014  WBC 3.4 - 10.8 x10E3/uL 8.4 9.1 8.7  Hemoglobin 11.1 - 15.9 g/dL - - 16.2(H)  Hematocrit 34.0 - 46.6 % 43.6 43.7 45.9  Platelets 150 - 379 x10E3/uL 306 265 696    Basic Metabolic Panel:    Component Value Date/Time   NA 139 02/06/2016 0712   K 4.5 02/06/2016 0712   CL 96 02/06/2016 0712   CO2 25 02/06/2016 0712   BUN 16 02/06/2016 0712   CREATININE 0.79 02/06/2016 0712   GLUCOSE 140* 02/06/2016 0712   CALCIUM 9.9 02/06/2016 0712   Hepatic Function Latest Ref Rng 02/06/2016 03/22/2015 03/23/2014  Total Protein 6.0 - 8.5 g/dL 7.1 6.7 7.2  Albumin 3.5 - 5.5 g/dL 4.4 4.0 4.3  AST 0 - 40 IU/L _0 ALT 0 - 32 IU/L _1 Alk Phosphatase 39 - 117 IU/L 58 63 66  Total Bilirubin 0.0 - 1.2 mg/dL 0.5 0.5 0.5  Bilirubin, Direct 0.00 - 0.40 mg/dL 0.13 0.12 0.11    Lab Results  Component Value Date   TSH 1.480 02/06/2016    No results found.  Assessment and Plan:   Routine general medical examination at a health care facility  Hyperlipidemia LDL goal <70: Chol is uncontrolled - change to crestor 40 mg for more potent statin  Uncontrolled type 2 diabetes mellitus without complication, without long-term current use of insulin (HCC) DM stable. 20 pound weight loss  Health Maintenance Exam: The patient's preventative maintenance and recommended screening tests for an annual wellness exam were reviewed in full today. Brought up to date unless services declined.  Counselled on the importance of diet, exercise, and its role in overall health and mortality. The patient's FH and SH was reviewed, including their home life, tobacco status, and drug and alcohol status.  Follow-up: No Follow-up on file. Or follow-up in 1 year for complete physical examination  New Prescriptions   ROSUVASTATIN (CRESTOR) 40 MG TABLET    Take 1 tablet (40 mg total) by mouth daily.   Signed,  [name]. Orton Capell, MD   Patient's Medications  New Prescriptions   ROSUVASTATIN (CRESTOR) 40 MG TABLET    Take 1 tablet (40 mg total) by mouth daily.  Previous Medications   BLOOD GLUCOSE MONITORING SUPPL (ONE TOUCH ULTRA MINI) W/DEVICE KIT    CHECK BLOOD SUGAR TWICE A DAY AS DIRECTED   HYDROCHLOROTHIAZIDE (HYDRODIURIL) 25 MG TABLET    Take 1 tablet by mouth  daily   LISINOPRIL (PRINIVIL,ZESTRIL) 20 MG TABLET    Take 1 tablet (20 mg total) by mouth daily.   METFORMIN (GLUCOPHAGE-XR) 500 MG 24 HR TABLET    Take 1 tablet (500 mg total) by mouth daily with breakfast.   MULTIPLE VITAMIN (MULTIVITAMIN) CAPSULE    Take 1 capsule by mouth daily. Reported on 12/26/2015   ONE TOUCH ULTRA TEST TEST STRIP    Use to check blood sugar two times a day.  Dx:  V89.38   ONETOUCH DELICA LANCETS FINE MISC    CHECK BLOOD SUGAR TWICE A DAY AS DIRECTED  Modified Medications   No medications on file  Discontinued Medications   DOCUSATE SODIUM (COLACE) 100 MG  CAPSULE    Take 1 capsule (100 mg total) by mouth 2 (two) times daily.   FLUCONAZOLE (DIFLUCAN) 150 MG TABLET    Take 1 tablet (150 mg total) by mouth once. May repeat in 1 week if needed   HYDROCODONE-ACETAMINOPHEN (NORCO/VICODIN) 5-325 MG TABLET  Take 1-2 tablets by mouth every 6 (six) hours as needed for moderate pain.   OMEGA-3 FATTY ACIDS (FISH OIL) 1000 MG CAPS    Take 1 capsule by mouth daily. Reported on 01/27/2016   PRAVASTATIN (PRAVACHOL) 40 MG TABLET    Take 1 tablet by mouth  daily   TAMSULOSIN (FLOMAX) 0.4 MG CAPS CAPSULE    TAKE 1 CAPSULE (0.4 MG TOTAL) BY MOUTH DAILY.

## 2016-02-10 NOTE — Progress Notes (Signed)
Pre visit review using our clinic review tool, if applicable. No additional management support is needed unless otherwise documented below in the visit note. 

## 2016-02-19 ENCOUNTER — Encounter: Payer: 59 | Admitting: Obstetrics and Gynecology

## 2016-02-20 ENCOUNTER — Encounter
Admission: RE | Admit: 2016-02-20 | Discharge: 2016-02-20 | Disposition: A | Discharge status: Home / Self Care | Payer: 59 | Source: Ambulatory Visit | Attending: Obstetrics and Gynecology | Admitting: Obstetrics and Gynecology

## 2016-02-20 DIAGNOSIS — Z01810 Encounter for preprocedural cardiovascular examination: Secondary | ICD-10-CM | POA: Diagnosis not present

## 2016-02-20 LAB — TYPE AND SCREEN
ABO/RH(D): O POS
Antibody Screen: NEGATIVE

## 2016-02-20 NOTE — Patient Instructions (Signed)
Your procedure is scheduled on: Thursday 02/27/16 Report to Day Surgery. 2ND FLOOR MEDICAL MALL ENTRANCE To find out your arrival time please call [phone] between 1PM - 3PM on Wednesday 02/26/16.  Remember: Instructions that are not followed completely may result in serious medical risk, up to and including death, or upon the discretion of your surgeon and anesthesiologist your surgery may need to be rescheduled.    __X__ 1. Do not eat food or drink liquids after midnight. No gum chewing or hard candies.     __X__ 2. No Alcohol for 24 hours before or after surgery.   ____ 3. Bring all medications with you on the day of surgery if instructed.    __X__ 4. Notify your doctor if there is any change in your medical condition     (cold, fever, infections).     Do not wear jewelry, make-up, hairpins, clips or nail polish.  Do not wear lotions, powders, or perfumes.   Do not shave 48 hours prior to surgery. Men may shave face and neck.  Do not bring valuables to the hospital.    [facility] belongings or [facility]uables.               Contacts, dentures or bridgework may not be worn into surgery.  Leave your suitcase in the car. After surgery it may be brought to your room.  For patients admitted to the hospital, discharge time is determined by your                treatment team.   Patients discharged the day of surgery will not be allowed to drive home.   Please read over the following fact sheets that you were given:   Surgical Site Infection Prevention   __X_ Take these medicines the morning of surgery with A SIP OF WATER:    1. LISINOPRIL  2.   3.   4.  5.  6.  ____ Fleet Enema (as directed)   __X__ Use CHG Soap as directed  ____ Use inhalers on the day of surgery  __X__ Stop metformin 2 days prior to surgery    ____ Take 1/2 of usual insulin dose the night before surgery and none on the morning of surgery.   ____ Stop Coumadin/Plavix/aspirin on    ____ Stop Anti-inflammatories on    ____ Stop supplements until after surgery.    ____ Bring C-Pap to the hospital.

## 2016-02-27 ENCOUNTER — Ambulatory Visit: Payer: 59 | Admitting: Certified Registered Nurse Anesthetist

## 2016-02-27 ENCOUNTER — Encounter: Payer: Self-pay | Admitting: Anesthesiology

## 2016-02-27 ENCOUNTER — Ambulatory Visit
Admission: RE | Admit: 2016-02-27 | Discharge: 2016-02-27 | Disposition: A | Discharge status: Home / Self Care | Payer: 59 | Source: Ambulatory Visit | Attending: Obstetrics and Gynecology | Admitting: Obstetrics and Gynecology

## 2016-02-27 ENCOUNTER — Encounter
Admission: RE | Disposition: A | Discharge status: Home / Self Care | Payer: Self-pay | Source: Ambulatory Visit | Attending: Obstetrics and Gynecology

## 2016-02-27 DIAGNOSIS — N802 Endometriosis of fallopian tube: Secondary | ICD-10-CM | POA: Diagnosis not present

## 2016-02-27 DIAGNOSIS — N801 Endometriosis of ovary: Secondary | ICD-10-CM | POA: Insufficient documentation

## 2016-02-27 DIAGNOSIS — Z79899 Other long term (current) drug therapy: Secondary | ICD-10-CM | POA: Insufficient documentation

## 2016-02-27 DIAGNOSIS — E119 Type 2 diabetes mellitus without complications: Secondary | ICD-10-CM | POA: Diagnosis not present

## 2016-02-27 DIAGNOSIS — R102 Pelvic and perineal pain: Secondary | ICD-10-CM | POA: Diagnosis present

## 2016-02-27 DIAGNOSIS — N83202 Unspecified ovarian cyst, left side: Secondary | ICD-10-CM

## 2016-02-27 DIAGNOSIS — I10 Essential (primary) hypertension: Secondary | ICD-10-CM | POA: Diagnosis not present

## 2016-02-27 DIAGNOSIS — Z7984 Long term (current) use of oral hypoglycemic drugs: Secondary | ICD-10-CM | POA: Insufficient documentation

## 2016-02-27 DIAGNOSIS — N83201 Unspecified ovarian cyst, right side: Secondary | ICD-10-CM | POA: Diagnosis present

## 2016-02-27 DIAGNOSIS — Z886 Allergy status to analgesic agent status: Secondary | ICD-10-CM | POA: Insufficient documentation

## 2016-02-27 HISTORY — PX: LAPAROSCOPIC OVARIAN CYSTECTOMY: SHX6248

## 2016-02-27 HISTORY — PX: LAPAROSCOPIC BILATERAL SALPINGECTOMY: SHX5889

## 2016-02-27 LAB — GLUCOSE, CAPILLARY
GLUCOSE-CAPILLARY: 173 mg/dL — AB (ref 65–99)
GLUCOSE-CAPILLARY: 167 mg/dL — AB (ref 65–99)

## 2016-02-27 LAB — POCT PREGNANCY, URINE: Preg Test, Ur: NEGATIVE

## 2016-02-27 SURGERY — EXCISION, CYST, OVARY, LAPAROSCOPIC
Anesthesia: General | Laterality: Right

## 2016-02-27 MED ORDER — LIDOCAINE HCL (CARDIAC) 20 MG/ML IV SOLN
INTRAVENOUS | Status: DC | PRN
  Administered 2016-02-27: 50 mg via INTRAVENOUS

## 2016-02-27 MED ORDER — PROMETHAZINE HCL 25 MG/ML IJ SOLN
6.2500 mg | INTRAMUSCULAR | Status: DC | PRN
  Administered 2016-02-27: 12.5 mg via INTRAVENOUS

## 2016-02-27 MED ORDER — OXYCODONE HCL 5 MG PO TABS: 5.0000 mg | ORAL_TABLET | Freq: Once | ORAL | Status: DC | PRN

## 2016-02-27 MED ORDER — FAMOTIDINE 20 MG PO TABS
20.0000 mg | ORAL_TABLET | Freq: Once | ORAL | Status: AC
  Administered 2016-02-27: 20 mg via ORAL

## 2016-02-27 MED ORDER — ACETAMINOPHEN 10 MG/ML IV SOLN
INTRAVENOUS | Status: AC
  Filled 2016-02-27: qty 100

## 2016-02-27 MED ORDER — DEXMEDETOMIDINE HCL 200 MCG/2ML IV SOLN
INTRAVENOUS | Status: DC | PRN
  Administered 2016-02-27: 4 ug via INTRAVENOUS

## 2016-02-27 MED ORDER — ROCURONIUM BROMIDE 100 MG/10ML IV SOLN
INTRAVENOUS | Status: DC | PRN
  Administered 2016-02-27: 25 mg via INTRAVENOUS
  Administered 2016-02-27 (×2): 10 mg via INTRAVENOUS
  Administered 2016-02-27: 5 mg via INTRAVENOUS

## 2016-02-27 MED ORDER — SODIUM CHLORIDE 0.9 % IJ SOLN
INTRAMUSCULAR | Status: AC
  Filled 2016-02-27: qty 10

## 2016-02-27 MED ORDER — SUCCINYLCHOLINE CHLORIDE 20 MG/ML IJ SOLN
INTRAMUSCULAR | Status: DC | PRN
  Administered 2016-02-27: 140 mg via INTRAVENOUS

## 2016-02-27 MED ORDER — FENTANYL CITRATE (PF) 100 MCG/2ML IJ SOLN
INTRAMUSCULAR | Status: DC | PRN
  Administered 2016-02-27 (×3): 50 ug via INTRAVENOUS

## 2016-02-27 MED ORDER — SODIUM CHLORIDE 0.9 % IV SOLN
INTRAVENOUS | Status: DC
  Administered 2016-02-27 (×2): via INTRAVENOUS

## 2016-02-27 MED ORDER — IBUPROFEN 600 MG PO TABS: 600.0000 mg | ORAL_TABLET | Freq: Four times a day (QID) | ORAL | Status: DC | PRN

## 2016-02-27 MED ORDER — PHENYLEPHRINE HCL 10 MG/ML IJ SOLN
INTRAMUSCULAR | Status: DC | PRN
  Administered 2016-02-27: 100 ug via INTRAVENOUS
  Administered 2016-02-27: 200 ug via INTRAVENOUS

## 2016-02-27 MED ORDER — DEXAMETHASONE SODIUM PHOSPHATE 10 MG/ML IJ SOLN
INTRAMUSCULAR | Status: DC | PRN
  Administered 2016-02-27: 10 mg via INTRAVENOUS

## 2016-02-27 MED ORDER — HYDROCODONE-ACETAMINOPHEN 5-325 MG PO TABS: 2.0000 | ORAL_TABLET | Freq: Four times a day (QID) | ORAL | Status: DC | PRN

## 2016-02-27 MED ORDER — ACETAMINOPHEN 10 MG/ML IV SOLN
INTRAVENOUS | Status: DC | PRN
  Administered 2016-02-27: 1000 mg via INTRAVENOUS

## 2016-02-27 MED ORDER — MIDAZOLAM HCL 2 MG/2ML IJ SOLN
INTRAMUSCULAR | Status: DC | PRN
  Administered 2016-02-27: 2 mg via INTRAVENOUS

## 2016-02-27 MED ORDER — KETOROLAC TROMETHAMINE 30 MG/ML IJ SOLN
INTRAMUSCULAR | Status: DC | PRN
  Administered 2016-02-27: 30 mg via INTRAVENOUS

## 2016-02-27 MED ORDER — OXYCODONE HCL 5 MG/5ML PO SOLN: 5.0000 mg | Freq: Once | ORAL | Status: DC | PRN

## 2016-02-27 MED ORDER — LIDOCAINE HCL (PF) 1 % IJ SOLN
INTRAMUSCULAR | Status: AC
  Filled 2016-02-27: qty 2

## 2016-02-27 MED ORDER — ONDANSETRON HCL 4 MG/2ML IJ SOLN
INTRAMUSCULAR | Status: DC | PRN
  Administered 2016-02-27: 4 mg via INTRAVENOUS

## 2016-02-27 MED ORDER — PROPOFOL 10 MG/ML IV BOLUS
INTRAVENOUS | Status: DC | PRN
  Administered 2016-02-27: 180 mg via INTRAVENOUS

## 2016-02-27 MED ORDER — BUPIVACAINE HCL (PF) 0.5 % IJ SOLN
INTRAMUSCULAR | Status: AC
  Filled 2016-02-27: qty 30

## 2016-02-27 MED ORDER — PROMETHAZINE HCL 25 MG/ML IJ SOLN
INTRAMUSCULAR | Status: AC
Start: 2016-02-27 — End: 2016-02-27
  Administered 2016-02-27: 12.5 mg via INTRAVENOUS
  Filled 2016-02-27: qty 1

## 2016-02-27 MED ORDER — FENTANYL CITRATE (PF) 100 MCG/2ML IJ SOLN: 25.0000 ug | INTRAMUSCULAR | Status: DC | PRN

## 2016-02-27 MED ORDER — FAMOTIDINE 20 MG PO TABS
ORAL_TABLET | ORAL | Status: AC
  Administered 2016-02-27: 20 mg via ORAL
  Filled 2016-02-27: qty 1

## 2016-02-27 MED ORDER — MEPERIDINE HCL 25 MG/ML IJ SOLN: 6.2500 mg | INTRAMUSCULAR | Status: DC | PRN

## 2016-02-27 MED ORDER — SUGAMMADEX SODIUM 200 MG/2ML IV SOLN
INTRAVENOUS | Status: DC | PRN
  Administered 2016-02-27: 240 mg via INTRAVENOUS

## 2016-02-27 MED ORDER — BUPIVACAINE HCL 0.5 % IJ SOLN
INTRAMUSCULAR | Status: DC | PRN
  Administered 2016-02-27: 10 mL

## 2016-02-27 SURGICAL SUPPLY — 50 items
BAG URO DRAIN 2000ML W/SPOUT (MISCELLANEOUS) ×4 IMPLANT
BLADE SURG SZ11 CARB STEEL (BLADE) ×4 IMPLANT
CANISTER SUCT 1200ML W/VALVE (MISCELLANEOUS) ×4 IMPLANT
CATH FOLEY 2WAY  5CC 16FR (CATHETERS) ×1
CATH ROBINSON RED A/P 16FR (CATHETERS) ×4 IMPLANT
CATH URTH 16FR FL 2W BLN LF (CATHETERS) ×3 IMPLANT
CHLORAPREP W/TINT 26ML (MISCELLANEOUS) ×4 IMPLANT
DRAPE LEGGINS SURG 28X43 STRL (DRAPES) ×4 IMPLANT
DRAPE SHEET LG 3/4 BI-LAMINATE (DRAPES) ×4 IMPLANT
DRAPE UNDER BUTTOCK W/FLU (DRAPES) ×4 IMPLANT
DRESSING TELFA 4X3 1S ST N-ADH (GAUZE/BANDAGES/DRESSINGS) ×4 IMPLANT
ELECT REM PT RETURN 9FT ADLT (ELECTROSURGICAL) ×4
ELECTRODE REM PT RTRN 9FT ADLT (ELECTROSURGICAL) ×3 IMPLANT
ENDOPOUCH RETRIEVER 10 (MISCELLANEOUS) ×4 IMPLANT
GLOVE BIO SURGEON STRL SZ7 (GLOVE) ×8 IMPLANT
GLOVE BIOGEL PI IND STRL 7.5 (GLOVE) ×3 IMPLANT
GLOVE BIOGEL PI INDICATOR 7.5 (GLOVE) ×1
GOWN STRL REUS W/ TWL LRG LVL3 (GOWN DISPOSABLE) ×6 IMPLANT
GOWN STRL REUS W/ TWL XL LVL3 (GOWN DISPOSABLE) ×3 IMPLANT
GOWN STRL REUS W/TWL LRG LVL3 (GOWN DISPOSABLE) ×2
GOWN STRL REUS W/TWL XL LVL3 (GOWN DISPOSABLE) ×1
GRASPER SUT TROCAR 14GX15 (MISCELLANEOUS) ×4 IMPLANT
IRRIGATION STRYKERFLOW (MISCELLANEOUS) ×6 IMPLANT
IRRIGATOR STRYKERFLOW (MISCELLANEOUS) ×8
IV LACTATED RINGERS 1000ML (IV SOLUTION) ×4 IMPLANT
KIT RM TURNOVER CYSTO AR (KITS) ×4 IMPLANT
LABEL OR SOLS (LABEL) ×4 IMPLANT
LIGASURE BLUNT 5MM 37CM (INSTRUMENTS) ×4 IMPLANT
LIQUID BAND (GAUZE/BANDAGES/DRESSINGS) ×4 IMPLANT
NDL SAFETY 22GX1.5 (NEEDLE) ×4 IMPLANT
NS IRRIG 500ML POUR BTL (IV SOLUTION) ×4 IMPLANT
PACK LAP CHOLECYSTECTOMY (MISCELLANEOUS) ×4 IMPLANT
PAD OB MATERNITY 4.3X12.25 (PERSONAL CARE ITEMS) ×4 IMPLANT
PAD PREP 24X41 OB/GYN DISP (PERSONAL CARE ITEMS) ×4 IMPLANT
SCISSORS METZENBAUM CVD 33 (INSTRUMENTS) IMPLANT
SET CYSTO W/LG BORE CLAMP LF (SET/KITS/TRAYS/PACK) ×4 IMPLANT
SHEARS HARMONIC ACE PLUS 36CM (ENDOMECHANICALS) IMPLANT
SLEEVE ENDOPATH XCEL 5M (ENDOMECHANICALS) ×4 IMPLANT
SOL PREP PVP 2OZ (MISCELLANEOUS) ×4
SOLUTION PREP PVP 2OZ (MISCELLANEOUS) ×3 IMPLANT
SURGILUBE 2OZ TUBE FLIPTOP (MISCELLANEOUS) ×4 IMPLANT
SUT MNCRL 4-0 (SUTURE)
SUT MNCRL 4-0 27XMFL (SUTURE)
SUT VIC AB 2-0 UR6 27 (SUTURE) IMPLANT
SUTURE MNCRL 4-0 27XMF (SUTURE) IMPLANT
TROCAR ENDO BLADELESS 11MM (ENDOMECHANICALS) IMPLANT
TROCAR XCEL NON-BLD 5MMX100MML (ENDOMECHANICALS) ×4 IMPLANT
TROCAR XCEL UNIV SLVE 11M 100M (ENDOMECHANICALS) IMPLANT
TUBING INSUFFLATOR HI FLOW (MISCELLANEOUS) ×4 IMPLANT
WATER STERILE IRR 3000ML UROMA (IV SOLUTION) ×4 IMPLANT

## 2016-02-27 NOTE — Anesthesia Procedure Notes (Signed)
Procedure Name: Intubation Date/Time: 02/27/2016 9:04 AM Performed by: Ginger CarneMICHELET, Kitt Minardi Pre-anesthesia Checklist: Patient identified, Emergency Drugs available, Suction available, Patient being monitored and Timeout performed Patient Re-evaluated:Patient Re-evaluated prior to inductionOxygen Delivery Method: Circle system utilized Preoxygenation: Pre-oxygenation with 100% oxygen Intubation Type: IV induction Ventilation: Mask ventilation without difficulty Laryngoscope Size: Miller and 2 Grade View: Grade I Tube type: Oral Tube size: 7.0 mm Number of attempts: 1 Airway Equipment and Method: Stylet Placement Confirmation: ETT inserted through vocal cords under direct vision,  positive ETCO2 and breath sounds checked- equal and bilateral Secured at: 22 cm Tube secured with: Tape Dental Injury: Teeth and Oropharynx as per pre-operative assessment

## 2016-02-27 NOTE — Anesthesia Postprocedure Evaluation (Signed)
Anesthesia Post Note  Patient: Joyce Rowe  Procedure(s) Performed: Procedure(s) (LRB): LAPAROSCOPIC OVARIAN CYSTECTOMY (Right) LAPAROSCOPIC BILATERAL SALPINGECTOMY (Bilateral) LAPAROSCOPIC OOPHORECTOMY (Right)  Patient location during evaluation: PACU Anesthesia Type: General Level of consciousness: awake and alert and oriented Pain management: pain level controlled Vital Signs Assessment: post-procedure vital signs reviewed and stable Respiratory status: spontaneous breathing, nonlabored ventilation and respiratory function stable Cardiovascular status: blood pressure returned to baseline and stable Postop Assessment: no signs of nausea or vomiting Anesthetic complications: no    Last Vitals:  Filed Vitals:   02/27/16 1115 02/27/16 1128  BP: 120/74 115/66  Pulse: 81 74  Temp:    Resp: 19 15    Last Pain:  Filed Vitals:   02/27/16 1129  PainSc: 0-No pain                 [name]

## 2016-02-27 NOTE — Transfer of Care (Signed)
Immediate Anesthesia Transfer of Care Note  Patient: Joyce CampbellLori S Mcgibbon  Procedure(s) Performed: Procedure(s): LAPAROSCOPIC OVARIAN CYSTECTOMY (Right) LAPAROSCOPIC BILATERAL SALPINGECTOMY (Bilateral) LAPAROSCOPIC OOPHORECTOMY (Right)  Patient Location: PACU  Anesthesia Type:General  Level of Consciousness: sedated  Airway & Oxygen Therapy: Patient Spontanous Breathing and Patient connected to face mask oxygen  Post-op Assessment: Report given to RN  Post vital signs: Reviewed and stable  Last Vitals:  Filed Vitals:   02/27/16 0823 02/27/16 1059  BP: 155/80 127/71  Pulse: 108 72  Temp: 36.8 C 36.6 C  Resp: 16 16    Last Pain:  Filed Vitals:   02/27/16 1100  PainSc: 1          Complications: No apparent anesthesia complications

## 2016-02-27 NOTE — Discharge Instructions (Signed)

## 2016-02-27 NOTE — Anesthesia Preprocedure Evaluation (Signed)
Anesthesia Evaluation  Patient identified by MRN, date of birth, ID band Patient awake    Reviewed: Allergy & Precautions, NPO status , Patient's Chart, lab work & pertinent test results  History of Anesthesia Complications Negative for: history of anesthetic complications  Airway Mallampati: I  TM Distance: >3 FB Neck ROM: Full    Dental no notable dental hx.    Pulmonary neg COPD,    breath sounds clear to auscultation- rhonchi (-) wheezing      Cardiovascular Exercise Tolerance: Good hypertension, (-) CAD and (-) Past MI  Rhythm:Regular Rate:Normal - Systolic murmurs and - Diastolic murmurs    Neuro/Psych negative psych ROS   GI/Hepatic negative GI ROS, Neg liver ROS,   Endo/Other  diabetes, Type 2, Oral Hypoglycemic Agents  Renal/GU Renal disease (nephrolithiasis)     Musculoskeletal negative musculoskeletal ROS (+)   Abdominal (+) + obese,   Peds  Hematology negative hematology ROS (+)   Anesthesia Other Findings Very anxious about surgery today  Reproductive/Obstetrics negative OB ROS                             Anesthesia Physical Anesthesia Plan  ASA: II  Anesthesia Plan: General   Post-op Pain Management:    Induction:   Airway Management Planned: Oral ETT  Additional Equipment:   Intra-op Plan:   Post-operative Plan: Extubation in OR  Informed Consent: I have reviewed the patients History and Physical, chart, labs and discussed the procedure including the risks, benefits and alternatives for the proposed anesthesia with the patient or authorized representative who has indicated his/her understanding and acceptance.   Dental advisory given  Plan Discussed with:   Anesthesia Plan Comments:         Anesthesia Quick Evaluation

## 2016-02-27 NOTE — Op Note (Signed)
Operative Note    Pre-Op Diagnosis:  1) ovarian cyst 2) pelvic pain  Post-Op Diagnosis:  1) ovarian cyst, right 2) pelvic pain  Procedures:  1. Operative laparoscopy 2. Right salpingo-oophorectomy 3. Right ovarian cystectomy 4. Left salpingectomy  Primary Surgeon: Thomasene MohairStephen Tigerlily Christine, MD   Assistant Surgeon: Ranae Plumberhelsea Ward, MD  EBL: 50 mL   IVF: 1,000 mL crystalloid  Urine output: 100 mL  Specimens:  1) Pelvic washings 2) right ovary and fallopian tube with cyst wall in ovary 3) left fallopian tube  Drains: None  Complications: None   Disposition: PACU   Condition: Stable   Findings:  1) adhesions of right ovarian cyst to pelvic sidewall and small intestine 2) adhesions of small bowel to uterus 3) normal appearing right ovary and fallopian tube  Procedure Summary:   The patient was taken to the operating room where general anesthesia was administered and found to be adequate. She was placed in the dorsal supine lithotomy position in [place] stirrups and prepped and draped in usual sterile fashion. After a timeout was called an indwelling catheter was placed in her bladder. A sterile sponge-on-a-stick was placed in her vagina for uterine manipulation.  Attention was turned to the abdomen where after injection of local anesthetic, a 5 mm infraumbilical incision was made with the scalpel. Entry into the abdomen was obtained via Optiview trocar technique (a blunt entry technique with camera visualization through the obturator upon entry). Verification of entry into the abdomen was obtained using opening pressures. The abdomen was insufflated with CO2. The camera was introduced through the trocar with verification of atraumatic entry.  A right lower quadrant 11mm port and a right lower quadrant 5mm port were placed under direct intra-abdominal camera visualization without difficulty. Pelvic washings were taken.  After adhesiolysis of filmy adhesions as described above, the  left mesosalpinx was transected from lateral to medial to remove the left fallopian tube.  This specimen was removed without difficulty.  The adhesions of the right ovarian cyst were removed.  The right ureter was identified and found to be well way from the operative area of interest, particularly the right IP ligament.  The right ovarian cyst was ruptured incidentally during lysis of adhesions.  A dark-chocolate fluid was removed from the cyst.  The right IP ligament was cauterized and transected using the Ligasure device.  The right fallopian tube and ovary were removed after transection of the right broad ligament from the IP medial to the right cornu.  The specimen was removed intact through the right lower quadrant port.  The pedicle lines were inspected and hemostasis was assured.  The right lower quadrant port was closed using a fascial closure device and 0 vicryl after removal of the trocar.  The abdomen was desuflated of CO2 and anesthesia gave the patient 5 deep breaths.  The remaining trocars were removed.  The umbilical and left lower quadrant skin closure was reinforced with a vertical mattress suture in the subcutaneous tissue.  The right lower quadrant skin was closed using 4-0 monocryl in a subcuticular fashion.  All incision was sealed with surgical skin glue.  The sponge and ring forceps were removed from the vagina and the foley catheter was removed from her bladder.  The vagina was swept to ensure no sponges or instruments were left behind.   The patient tolerated the procedure well.  Sponge, lap, needle, and instrument counts were correct x 2.  VTE prophylaxis: SCDs. Antibiotic prophylaxis: none indicated nor received. She was awakened  in the operating room and was taken to the PACU in stable condition.   Thomasene Mohair, MD 02/27/2016 10:46 AM

## 2016-02-27 NOTE — H&P (Signed)
History and Physical Interval Note:  Joyce CampbellLori S Spindel  has presented today for surgery, with the diagnosis of OVARIAN CYST LEFT  The various methods of treatment have been discussed with the patient and family. After consideration of risks, benefits and other options for treatment, the patient has consented to  Procedure(s): LAPAROSCOPY OPERATIVE (N/A) LAPAROSCOPIC OVARIAN CYSTECTOMY (Left) LAPAROSCOPIC BILATERAL SALPINGECTOMY (Bilateral) LAPAROSCOPIC OOPHORECTOMY (Left) CYSTOSCOPY (N/A) as a surgical intervention .  The patient's history has been reviewed, patient examined, no change in status, stable for surgery.  I have reviewed the patient's chart and labs.  Questions were answered to the patient's satisfaction.    Conard NovakJackson, Stephen D, MD 02/27/2016 8:30 AM

## 2016-02-28 LAB — SURGICAL PATHOLOGY

## 2016-02-28 LAB — CYTOLOGY - NON PAP

## 2016-03-10 ENCOUNTER — Other Ambulatory Visit: Payer: Self-pay | Admitting: Family Medicine

## 2016-03-10 DIAGNOSIS — Z1231 Encounter for screening mammogram for malignant neoplasm of breast: Secondary | ICD-10-CM

## 2016-03-15 ENCOUNTER — Other Ambulatory Visit: Payer: Self-pay | Admitting: Family Medicine

## 2016-04-22 ENCOUNTER — Other Ambulatory Visit: Payer: Self-pay | Admitting: Family Medicine

## 2016-04-22 ENCOUNTER — Ambulatory Visit
Admission: RE | Admit: 2016-04-22 | Discharge: 2016-04-22 | Disposition: A | Discharge status: Home / Self Care | Payer: 59 | Source: Ambulatory Visit | Attending: Family Medicine | Admitting: Family Medicine

## 2016-04-22 DIAGNOSIS — Z1231 Encounter for screening mammogram for malignant neoplasm of breast: Secondary | ICD-10-CM | POA: Diagnosis not present

## 2016-11-29 ENCOUNTER — Encounter: Payer: Self-pay | Admitting: Family Medicine

## 2016-11-29 DIAGNOSIS — E119 Type 2 diabetes mellitus without complications: Secondary | ICD-10-CM

## 2016-11-29 DIAGNOSIS — Z0000 Encounter for general adult medical examination without abnormal findings: Secondary | ICD-10-CM

## 2016-12-07 NOTE — Telephone Encounter (Signed)
Joyce Rowe, can you help me do this? So she can draw at a [facility] station and let her know?  Thanks  FLP, z00.00 A1c, E11.9 Urine microalbumin, E11.9 Cbc with diff, HFP, BMET: z00.00 TSH: z00.00

## 2016-12-09 ENCOUNTER — Ambulatory Visit: Payer: 59 | Admitting: Family Medicine

## 2016-12-11 ENCOUNTER — Other Ambulatory Visit: Payer: Self-pay

## 2016-12-11 ENCOUNTER — Ambulatory Visit
Admission: RE | Admit: 2016-12-11 | Discharge: 2016-12-11 | Disposition: A | Discharge status: Home / Self Care | Payer: 59 | Source: Ambulatory Visit | Attending: Urology | Admitting: Urology

## 2016-12-11 DIAGNOSIS — N200 Calculus of kidney: Secondary | ICD-10-CM

## 2016-12-19 LAB — HEPATIC FUNCTION PANEL
ALT: 26 IU/L (ref 0–32)
AST: 28 IU/L (ref 0–40)
Albumin: 4.2 g/dL (ref 3.5–5.5)
Alkaline Phosphatase: 60 IU/L (ref 39–117)
BILIRUBIN TOTAL: 0.5 mg/dL (ref 0.0–1.2)
BILIRUBIN, DIRECT: 0.11 mg/dL (ref 0.00–0.40)
TOTAL PROTEIN: 6.9 g/dL (ref 6.0–8.5)

## 2016-12-19 LAB — CBC WITH DIFFERENTIAL/PLATELET
BASOS ABS: 0 10*3/uL (ref 0.0–0.2)
Basos: 0 %
EOS (ABSOLUTE): 0.3 10*3/uL (ref 0.0–0.4)
EOS: 3 %
HEMATOCRIT: 43 % (ref 34.0–46.6)
HEMOGLOBIN: 14.6 g/dL (ref 11.1–15.9)
Immature Grans (Abs): 0 10*3/uL (ref 0.0–0.1)
Immature Granulocytes: 0 %
LYMPHS ABS: 3.5 10*3/uL — AB (ref 0.7–3.1)
LYMPHS: 43 %
MCH: 27.9 pg (ref 26.6–33.0)
MCHC: 34 g/dL (ref 31.5–35.7)
MCV: 82 fL (ref 79–97)
MONOCYTES: 7 %
Monocytes Absolute: 0.6 10*3/uL (ref 0.1–0.9)
Neutrophils Absolute: 3.8 10*3/uL (ref 1.4–7.0)
Neutrophils: 47 %
Platelets: 276 10*3/uL (ref 150–379)
RBC: 5.23 x10E6/uL (ref 3.77–5.28)
RDW: 14.6 % (ref 12.3–15.4)
WBC: 8.2 10*3/uL (ref 3.4–10.8)

## 2016-12-19 LAB — BASIC METABOLIC PANEL
BUN / CREAT RATIO: 17 (ref 9–23)
BUN: 14 mg/dL (ref 6–24)
CALCIUM: 9.8 mg/dL (ref 8.7–10.2)
CO2: 25 mmol/L (ref 18–29)
CREATININE: 0.84 mg/dL (ref 0.57–1.00)
Chloride: 96 mmol/L (ref 96–106)
GFR calc non Af Amer: 82 mL/min/{1.73_m2} (ref >59)
GFR, EST AFRICAN AMERICAN: 95 mL/min/{1.73_m2} (ref >59)
GLUCOSE: 151 mg/dL — AB (ref 65–99)
Potassium: 4.8 mmol/L (ref 3.5–5.2)
Sodium: 138 mmol/L (ref 134–144)

## 2016-12-19 LAB — LIPID PANEL
CHOL/HDL RATIO: 3.5 ratio (ref 0.0–4.4)
CHOLESTEROL TOTAL: 172 mg/dL (ref 100–199)
HDL: 49 mg/dL (ref >39)
LDL CALC: 73 mg/dL (ref 0–99)
TRIGLYCERIDES: 248 mg/dL — AB (ref 0–149)
VLDL Cholesterol Cal: 50 mg/dL — ABNORMAL HIGH (ref 5–40)

## 2016-12-19 LAB — HEMOGLOBIN A1C
Est. average glucose Bld gHb Est-mCnc: 146 mg/dL
Hgb A1c MFr Bld: 6.7 % — ABNORMAL HIGH (ref 4.8–5.6)

## 2016-12-19 LAB — MICROALBUMIN / CREATININE URINE RATIO
Creatinine, Urine: 147.2 mg/dL
MICROALB/CREAT RATIO: 7.4 mg/g{creat} (ref 0.0–30.0)
Microalbumin, Urine: 10.9 ug/mL

## 2016-12-19 LAB — TSH: TSH: 2.17 u[IU]/mL (ref 0.450–4.500)

## 2017-01-25 NOTE — Progress Notes (Signed)
2:17 PM   RAYANNA MATUSIK [birth year] [id number]  Referring provider: Hannah Beat, MD [address] [place], [place] [address]  Chief Complaint  Patient presents with  . Nephrolithiasis    1 year follow up  . Hematuria    HPI: Patient is a 51 year old Caucasian female who presents today for a one year follow up for a history of nephrolithiasis.    Background history Patient is status post left ESWL for a large pelvic stone on 05/052017.  Patient was referral from their PCP, Dr.Copeland, for microscopic hematuria.   CT Urogram completed on 11/25/2015 noted a large nonobstructing stone in the left renal pelvis with clustered tiny stones in the left lower pole collecting system. She chose ESWL as definitive management for her left renal stone. She did not pursue placement of a ureteral stent prior to the procedure.  She states that she has not had any complications post procedurally. She brought in fragments that she has passed and collected.  KUB taken on 12/25/2015 noted that the previous identified left renal pelvic stone was no longer identified. The small left upper renal pole calyceal stones could not be excluded.    Renal ultrasound completed on 12/31/2015 noted no hydronephrosis or kidney stones.    Her stone analysis noted a composition of 95% uric acid and 5% ammonia urate stone.  KUB taken in 12/2016 noted no stones.  I have independently reviewed the films.    She was also known to have bilateral cystic ovarian masses.  Patient has been seen and evaluated by gynecology for her ovarian masses.          PMH: Past Medical History:  Diagnosis Date  . Diabetes mellitus type 2, uncontrolled, without complications (HCC) 04/03/2015  . Diabetes mellitus without complication (HCC)   . Hyperlipidemia   . Kidney stone   . Microscopic hematuria   . Morbid obesity with BMI of 40.0-44.9, adult (HCC) 03/28/2014  . Unspecified essential hypertension     Surgical  History: Past Surgical History:  Procedure Laterality Date  . CESAREAN SECTION  08/2002  . EXTRACORPOREAL SHOCK WAVE LITHOTRIPSY Left 12/12/2015   Procedure: EXTRACORPOREAL SHOCK WAVE LITHOTRIPSY (ESWL);  Surgeon: Vanna Scotland, MD;  Location: [facility] OR[facility];  Service: Urology;  Laterality: Left;  . LAPAROSCOPIC BILATERAL SALPINGECTOMY Bilateral 02/27/2016   Procedure: LAPAROSCOPIC BILATERAL SALPINGECTOMY;  Surgeon: Conard Novak, MD;  Location: [facility] OR[facility];  Service: Gynecology;  Laterality: Bilateral;  . LAPAROSCOPIC OVARIAN CYSTECTOMY Right 02/27/2016   Procedure: LAPAROSCOPIC OVARIAN CYSTECTOMY;  Surgeon: Conard Novak, MD;  Location: [facility] OR[facility];  Service: Gynecology;  Laterality: Right;  . tubes in ears     70's    Home Medications:  Allergies as of 01/26/2017      Reactions   Naproxen    REACTION: Rash   Bextra [valdecoxib] Rash   Metaxalone Rash   REACTION: Rash      Medication List       Accurate as of 01/26/17  2:17 PM. Always use your most recent med list.          hydrochlorothiazide 25 MG tablet Commonly known as:  HYDRODIURIL Take 1 tablet by mouth  daily   HYDROcodone-acetaminophen 5-325 MG tablet Commonly known as:  NORCO Take 2 tablets by mouth every 6 (six) hours as needed for moderate pain.   ibuprofen 600 MG tablet Commonly known as:  ADVIL,MOTRIN Take 1 tablet (600 mg total) by mouth every 6 (six) hours as needed for  mild pain or cramping.   lisinopril 20 MG tablet Commonly known as:  PRINIVIL,ZESTRIL Take 1 tablet by mouth  daily   metFORMIN 500 MG 24 hr tablet Commonly known as:  GLUCOPHAGE-XR Take 1 tablet by mouth  daily with breakfast   multivitamin with minerals Tabs tablet Take 1 tablet by mouth daily.   ONE TOUCH ULTRA TEST test strip Generic drug:  glucose blood Use to check blood sugar  two times a day   pravastatin 40 MG tablet Commonly known as:  PRAVACHOL Take 40 mg by mouth daily.   rosuvastatin 5 MG tablet Commonly known as:   CRESTOR Take 5 mg by mouth daily.       Allergies:  Allergies  Allergen Reactions  . Naproxen     REACTION: Rash  . Bextra [Valdecoxib] Rash  . Metaxalone Rash    REACTION: Rash    Family History: Family History  Problem Relation Age of Onset  . Hypertension Mother   . Hypertension Father   . Hematuria Father   . Colon cancer Unknown        Grandparents  <60  . Heart disease Unknown        Grandparents  . Diabetes Maternal Grandmother   . Breast cancer Neg Hx   . Kidney disease Neg Hx   . Bladder Cancer Neg Hx   . Kidney cancer Neg Hx     Social History:  reports that she has never smoked. She has never used smokeless tobacco. She reports that she does not drink alcohol or use drugs.  ROS: UROLOGY Frequent Urination?: No Hard to postpone urination?: No Burning/pain with urination?: No Get up at night to urinate?: No Leakage of urine?: No Urine stream starts and stops?: No Trouble starting stream?: No Do you have to strain to urinate?: No Blood in urine?: No Urinary tract infection?: No Sexually transmitted disease?: No Injury to kidneys or bladder?: No Painful intercourse?: No Weak stream?: No Currently pregnant?: No Vaginal bleeding?: No Last menstrual period?: n  Gastrointestinal Nausea?: No Vomiting?: No Indigestion/heartburn?: No Diarrhea?: No Constipation?: No  Constitutional Fever: No Night sweats?: No Weight loss?: No Fatigue?: No  Skin Skin rash/lesions?: No Itching?: No  Eyes Blurred vision?: No Double vision?: No  Ears/Nose/Throat Sore throat?: No Sinus problems?: No  Hematologic/Lymphatic Swollen glands?: No Easy bruising?: No  Cardiovascular Leg swelling?: No Chest pain?: No  Respiratory Cough?: No Shortness of breath?: No  Endocrine Excessive thirst?: No  Musculoskeletal Back pain?: No Joint pain?: No  Neurological Headaches?: No Dizziness?: No  Psychologic Depression?: No Anxiety?: No  Physical  Exam: BP (!) 165/95   Pulse (!) 114   Ht 5\' 8"  (1.727 m)   Wt 270 lb 14.4 oz (122.9 kg)   LMP 01/09/2017   BMI 41.19 kg/m   Constitutional: Well nourished. Alert and oriented, No acute distress. HEENT: [place] AT, moist mucus membranes. Trachea midline, no masses. Cardiovascular: No clubbing, cyanosis, or edema. Respiratory: Normal respiratory effort, no increased work of breathing. Skin: No rashes, bruises or suspicious lesions. Lymph: No cervical or inguinal adenopathy. Neurologic: Grossly intact, no focal deficits, moving all 4 extremities. Psychiatric: Normal mood and affect.  Laboratory Data: Lab Results  Component Value Date   WBC 8.2 12/18/2016   HGB 14.6 12/18/2016   HCT 43.0 12/18/2016   MCV 82 12/18/2016   PLT 276 12/18/2016    Lab Results  Component Value Date   CREATININE 0.84 12/18/2016    Lab Results  Component Value  Date   HGBA1C 6.7 (H) 12/18/2016    Lab Results  Component Value Date   TSH 2.170 12/18/2016       Component Value Date/Time   CHOL 172 12/18/2016 0925   HDL 49 12/18/2016 0925   CHOLHDL 3.5 12/18/2016 0925   LDLCALC 73 12/18/2016 0925    Lab Results  Component Value Date   AST 28 12/18/2016   Lab Results  Component Value Date   ALT 26 12/18/2016    Results for orders placed or performed in visit on 11/29/16  Lipid panel  Result Value Ref Range   Cholesterol, Total 172 100 - 199 mg/dL   Triglycerides 409 (H) 0 - 149 mg/dL   HDL 49 >81 mg/dL   VLDL Cholesterol Cal 50 (H) 5 - 40 mg/dL   LDL Calculated 73 0 - 99 mg/dL   Chol/HDL Ratio 3.5 0.0 - 4.4 ratio  Hemoglobin A1c  Result Value Ref Range   Hgb A1c MFr Bld 6.7 (H) 4.8 - 5.6 %   Est. average glucose Bld gHb Est-mCnc 146 mg/dL  Microalbumin / creatinine urine ratio  Result Value Ref Range   Creatinine, Urine 147.2 Not Estab. mg/dL   Albumin, Urine 19.1 Not Estab. ug/mL   Microalb/Creat Ratio 7.4 0.0 - 30.0 mg/g creat  TSH  Result Value Ref Range   TSH 2.170 0.450 -  4.500 uIU/mL  Hepatic function panel  Result Value Ref Range   Total Protein 6.9 6.0 - 8.5 g/dL   Albumin 4.2 3.5 - 5.5 g/dL   Bilirubin Total 0.5 0.0 - 1.2 mg/dL   Bilirubin, Direct 4.78 0.00 - 0.40 mg/dL   Alkaline Phosphatase 60 39 - 117 IU/L   AST 28 0 - 40 IU/L   ALT 26 0 - 32 IU/L  Basic metabolic panel  Result Value Ref Range   Glucose 151 (H) 65 - 99 mg/dL   BUN 14 6 - 24 mg/dL   Creatinine, Ser 2.95 0.57 - 1.00 mg/dL   GFR calc non Af Amer 82 >59 mL/min/1.73   GFR calc Af Amer 95 >59 mL/min/1.73   BUN/Creatinine Ratio 17 9 - 23   Sodium 138 134 - 144 mmol/L   Potassium 4.8 3.5 - 5.2 mmol/L   Chloride 96 96 - 106 mmol/L   CO2 25 18 - 29 mmol/L   Calcium 9.8 8.7 - 10.2 mg/dL  CBC with Differential/Platelet  Result Value Ref Range   WBC 8.2 3.4 - 10.8 x10E3/uL   RBC 5.23 3.77 - 5.28 x10E6/uL   Hemoglobin 14.6 11.1 - 15.9 g/dL   Hematocrit 62.1 30.8 - 46.6 %   MCV 82 79 - 97 fL   MCH 27.9 26.6 - 33.0 pg   MCHC 34.0 31.5 - 35.7 g/dL   RDW 65.7 84.6 - 96.2 %   Platelets 276 150 - 379 x10E3/uL   Neutrophils 47 Not Estab. %   Lymphs 43 Not Estab. %   Monocytes 7 Not Estab. %   Eos 3 Not Estab. %   Basos 0 Not Estab. %   Neutrophils Absolute 3.8 1.4 - 7.0 x10E3/uL   Lymphocytes Absolute 3.5 (H) 0.7 - 3.1 x10E3/uL   Monocytes Absolute 0.6 0.1 - 0.9 x10E3/uL   EOS (ABSOLUTE) 0.3 0.0 - 0.4 x10E3/uL   Basophils Absolute 0.0 0.0 - 0.2 x10E3/uL   Immature Granulocytes 0 Not Estab. %   Immature Grans (Abs) 0.0 0.0 - 0.1 x10E3/uL   Pertinent imaging CLINICAL DATA:  51 year old with personal history  of left urinary tract calculi post lithotripsy 1 year ago. Patient currently asymptomatic.  EXAM: ABDOMEN - 1 VIEW  COMPARISON:  Abdomen x-ray 12/25/2015. CT abdomen pelvis 11/25/2015.  FINDINGS: Small phleboliths low in the left side of the pelvis as noted on the prior CT. No visible opaque urinary tract calculi on either side currently. Bowel-gas pattern  unremarkable. Moderate colonic stool burden. Degenerative changes involving the sacroiliac joints and the symphysis pubis as noted previously.  IMPRESSION: 1. No acute abdominal abnormality. 2. No visible opaque urinary tract calculi on either side currently.   Electronically Signed   By: Hulan Saas M.D.   On: 12/11/2016 10:37   Assessment & Plan:    1. History of nephrolithiasis  - KUB taken today did not demonstrate any nephrolithiasis  - She has increased her water intake to several cups today, eliminated soft drinks and added citrus juices to her diet  - Advised to contact our office or seek treatment in the ED if becomes febrile or pain/ vomiting are difficult control in order to arrange for emergent/urgent intervention  - She will return in 1 year for KUB.   Return in about 1 year (around 01/26/2018) for KUB and office visit.  These notes generated with voice recognition software. I apologize for typographical errors.  Michiel Cowboy, PA-C  [facility] [address], [address] [place], [place] [address] [phone]

## 2017-01-26 ENCOUNTER — Encounter: Payer: Self-pay | Admitting: Urology

## 2017-01-26 ENCOUNTER — Ambulatory Visit (INDEPENDENT_AMBULATORY_CARE_PROVIDER_SITE_OTHER): Payer: 59 | Admitting: Urology

## 2017-01-26 VITALS — BP 165/95 | HR 114 | Ht 68.0 in | Wt 270.9 lb

## 2017-01-26 DIAGNOSIS — Z87442 Personal history of urinary calculi: Secondary | ICD-10-CM | POA: Diagnosis not present

## 2017-01-27 ENCOUNTER — Other Ambulatory Visit: Payer: Self-pay

## 2017-01-27 MED ORDER — HYDROCHLOROTHIAZIDE 25 MG PO TABS: 25.0000 mg | ORAL_TABLET | Freq: Every day | ORAL | 0 refills | Status: DC

## 2017-01-27 NOTE — Telephone Encounter (Signed)
Pt request # [id number] of HCTZ sent to [facility]. Pt last seen 02/10/16 and pt has appt on 02/08/17, pt having problem getting refill thru [facility]pt is now out of med. Advised pt refilled per protocol and pt will ck with pharmacy.

## 2017-02-08 ENCOUNTER — Encounter: Payer: Self-pay | Admitting: Family Medicine

## 2017-02-08 ENCOUNTER — Ambulatory Visit (INDEPENDENT_AMBULATORY_CARE_PROVIDER_SITE_OTHER): Payer: 59 | Admitting: Family Medicine

## 2017-02-08 VITALS — BP 160/88 | HR 111 | Temp 98.4°F | Ht 67.5 in | Wt 268.5 lb

## 2017-02-08 DIAGNOSIS — I10 Essential (primary) hypertension: Secondary | ICD-10-CM | POA: Diagnosis not present

## 2017-02-08 DIAGNOSIS — Z0000 Encounter for general adult medical examination without abnormal findings: Secondary | ICD-10-CM

## 2017-02-08 DIAGNOSIS — Z0001 Encounter for general adult medical examination with abnormal findings: Secondary | ICD-10-CM

## 2017-02-08 MED ORDER — HYDROCHLOROTHIAZIDE 25 MG PO TABS: 25.0000 mg | ORAL_TABLET | Freq: Every day | ORAL | 3 refills | Status: DC

## 2017-02-08 MED ORDER — ROSUVASTATIN CALCIUM 40 MG PO TABS: 40.0000 mg | ORAL_TABLET | Freq: Every day | ORAL | 3 refills | Status: DC

## 2017-02-08 MED ORDER — LISINOPRIL 40 MG PO TABS: 20.0000 mg | ORAL_TABLET | Freq: Every day | ORAL | 3 refills | Status: DC

## 2017-02-08 MED ORDER — METFORMIN HCL ER 500 MG PO TB24
500.0000 mg | ORAL_TABLET | Freq: Every day | ORAL | 3 refills | Status: DC
Start: 2017-02-08 — End: 2018-02-23

## 2017-02-08 MED ORDER — METFORMIN HCL ER 500 MG PO TB24: 500.0000 mg | ORAL_TABLET | Freq: Every day | ORAL | 3 refills | Status: DC

## 2017-02-08 NOTE — Progress Notes (Signed)
Dr. Frederico Hamman T. Aladdin Kollmann, MD, [facility] [place], [address] Phone: [phone] Fax: [phone]  02/08/2017  Patient: Joyce Rowe, MRN: [id number], DOB: [birth year], 51 y.o.  Primary Physician:  Owens Loffler, MD   Chief Complaint  Patient presents with  . Medication Refill   Subjective:   Joyce Rowe is a 51 y.o. pleasant patient who presents with the following:  Health Maintenance Summary Reviewed and updated, unless pt declines services.  DM stable BP elevated - highly anxious  Tobacco History Reviewed. Non-smoker Alcohol: No concerns, no excessive use Exercise Habits: Some activity, rec at least 30 mins 5 times a week STD concerns: none Drug Use: None Lumps or breast concerns: no Breast Cancer Family History: no  Health Maintenance  Topic Date Due  . HIV Screening  01/05/1983  . FOOT EXAM  02/09/2017  . INFLUENZA VACCINE  03/10/2017  . PAP SMEAR  03/28/2017  . MAMMOGRAM  04/22/2017  . HEMOGLOBIN A1C  06/20/2017  . OPHTHALMOLOGY EXAM  02/08/2018  . PNEUMOCOCCAL POLYSACCHARIDE VACCINE (2) 05/12/2020  . TETANUS/TDAP  03/28/2024    Immunization History  Administered Date(s) Administered  . Influenza,inj,Quad PF,36+ Mos 05/13/2015  . Pneumococcal Polysaccharide-23 05/13/2015  . Tdap 03/28/2014   Patient Active Problem List   Diagnosis Date Noted  . Kidney stones 12/28/2015  . Cysts of both ovaries 12/28/2015  . Microscopic hematuria 11/12/2015  . Essential hypertension 08/21/2015  . Diabetes mellitus type 2, uncontrolled, without complications ([place]) 72/90/2111  . Hyperlipidemia LDL goal <70 03/28/2014  . Morbid obesity with BMI of 40.0-44.9, adult ([place]) 03/28/2014   Past Medical History:  Diagnosis Date  . Diabetes mellitus type 2, uncontrolled, without complications ([place]) 5/52/0802  . Diabetes mellitus without complication ([place])   . Hyperlipidemia   . Kidney stone   . Microscopic  hematuria   . Morbid obesity with BMI of 40.0-44.9, adult ([place]) 03/28/2014  . Unspecified essential hypertension    Past Surgical History:  Procedure Laterality Date  . CESAREAN SECTION  08/2002  . EXTRACORPOREAL SHOCK WAVE LITHOTRIPSY Left 12/12/2015   Procedure: EXTRACORPOREAL SHOCK WAVE LITHOTRIPSY (ESWL);  Surgeon: Hollice Espy, MD;  Location: [facility] ORS;  Service: Urology;  Laterality: Left;  . LAPAROSCOPIC BILATERAL SALPINGECTOMY Bilateral 02/27/2016   Procedure: LAPAROSCOPIC BILATERAL SALPINGECTOMY;  Surgeon: Will Bonnet, MD;  Location: [facility];  Service: Gynecology;  Laterality: Bilateral;  . LAPAROSCOPIC OVARIAN CYSTECTOMY Right 02/27/2016   Procedure: LAPAROSCOPIC OVARIAN CYSTECTOMY;  Surgeon: Will Bonnet, MD;  Location: [facility];  Service: Gynecology;  Laterality: Right;  . tubes in ears     70's   Social History   Social History  . Marital status: Married    Spouse name: N/A  . Number of children: 1  . Years of education: N/A   Occupational History  . BS, SAHM    Social History Main Topics  . Smoking status: Never Smoker  . Smokeless tobacco: Never Used  . Alcohol use No  . Drug use: No  . Sexual activity: Not on file   Other Topics Concern  . Not on file   Social History Narrative   Married      1 child      BS, SAHM      6-8 hours sleep per night      3 people living in home      Regular exercise-yes   Family History  Problem Relation Age of Onset  .  Hypertension Mother   . Hypertension Father   . Hematuria Father   . Colon cancer Unknown        Grandparents  <60  . Heart disease Unknown        Grandparents  . Diabetes Maternal Grandmother   . Breast cancer Neg Hx   . Kidney disease Neg Hx   . Bladder Cancer Neg Hx   . Kidney cancer Neg Hx    Allergies  Allergen Reactions  . Naproxen     REACTION: Rash  . Bextra [Valdecoxib] Rash  . Metaxalone Rash    REACTION: Rash    Medication list has been reviewed and  updated.   General: Denies fever, chills, sweats. No significant weight loss. Eyes: Denies blurring,significant itching ENT: Denies earache, sore throat, and hoarseness.  Cardiovascular: Denies chest pains, palpitations, dyspnea on exertion,  Respiratory: Denies cough, dyspnea at rest,wheeezing Breast: no concerns about lumps GI: Denies nausea, vomiting, diarrhea, constipation, change in bowel habits, abdominal pain, melena, hematochezia GU: Denies dysuria, hematuria, urinary hesitancy, nocturia, denies STD risk, no concerns about discharge Musculoskeletal: Denies back pain, joint pain Derm: Denies rash, itching Neuro: Denies  paresthesias, frequent falls, frequent headaches Psych: Denies depression, anxiety Endocrine: Denies cold intolerance, heat intolerance, polydipsia Heme: Denies enlarged lymph nodes Allergy: No hayfever  Objective:   BP (!) 160/88   Pulse (!) 111   Temp 98.4 F (36.9 C) (Oral)   Ht 5' 7.5" (1.715 m)   Wt 268 lb 8 oz (121.8 kg)   LMP 01/09/2017   BMI 41.43 kg/m  No exam data present  GEN: well developed, well nourished, no acute distress Eyes: conjunctiva and lids normal, PERRLA, EOMI ENT: TM clear, nares clear, oral exam WNL Neck: supple, no lymphadenopathy, no thyromegaly, no JVD Pulm: clear to auscultation and percussion, respiratory effort normal CV: regular rate and rhythm, S1-S2, no murmur, rub or gallop, no bruits Chest: no scars, masses, no lumps BREAST: breast exam declined GI: soft, non-tender; no hepatosplenomegaly, masses; active bowel sounds all quadrants GU: GU exam declined Lymph: no cervical, axillary or inguinal adenopathy MSK: gait normal, muscle tone and strength WNL, no joint swelling, effusions, discoloration, crepitus  SKIN: clear, good turgor, color WNL, no rashes, lesions, or ulcerations Neuro: normal mental status, normal strength, sensation, and motion Psych: alert; oriented to person, place and time, normally interactive  and not anxious or depressed in appearance.   All labs reviewed with patient. Lipids:    Component Value Date/Time   CHOL 172 12/18/2016 0925   TRIG 248 (H) 12/18/2016 0925   HDL 49 12/18/2016 0925   CHOLHDL 3.5 12/18/2016 0925   CBC: CBC Latest Ref Rng & Units 12/18/2016 02/06/2016 03/22/2015  WBC 3.4 - 10.8 x10E3/uL 8.2 8.4 9.1  Hemoglobin 11.1 - 15.9 g/dL 14.6 15.2 15.1  Hematocrit 34.0 - 46.6 % 43.0 43.6 43.7  Platelets 150 - 379 x10E3/uL 276 306 098    Basic Metabolic Panel:    Component Value Date/Time   NA 138 12/18/2016 0925   K 4.8 12/18/2016 0925   CL 96 12/18/2016 0925   CO2 25 12/18/2016 0925   BUN 14 12/18/2016 0925   CREATININE 0.84 12/18/2016 0925   GLUCOSE 151 (H) 12/18/2016 0925   CALCIUM 9.8 12/18/2016 0925   Hepatic Function Latest Ref Rng & Units 12/18/2016 02/06/2016 03/22/2015  Total Protein 6.0 - 8.5 g/dL 6.9 7.1 6.7  Albumin 3.5 - 5.5 g/dL 4.2 4.4 4.0  AST 0 - 40 IU/L 28 19 26  ALT 0 - 32 IU/L 26 20 29   Alk Phosphatase 39 - 117 IU/L 60 58 63  Total Bilirubin 0.0 - 1.2 mg/dL 0.5 0.5 0.5  Bilirubin, Direct 0.00 - 0.40 mg/dL 0.11 0.13 0.12    Lab Results  Component Value Date   TSH 2.170 12/18/2016   No results found.  Assessment and Plan:   Routine general medical examination at a health care facility  Increase ace inh to 40 mg  Health Maintenance Exam: The patient's preventative maintenance and recommended screening tests for an annual wellness exam were reviewed in full today. Brought up to date unless services declined.  Counselled on the importance of diet, exercise, and its role in overall health and mortality. The patient's FH and SH was reviewed, including their home life, tobacco status, and drug and alcohol status.  Follow-up in 1 year for physical exam or additional follow-up below.  Follow-up: Return in about 6 months (around 08/11/2017). Or follow-up in 1 year if not noted.  Meds ordered this encounter  Medications  .  DISCONTD: rosuvastatin (CRESTOR) 40 MG tablet    Sig: Take 40 mg by mouth daily.  Marland Kitchen lisinopril (PRINIVIL,ZESTRIL) 40 MG tablet    Sig: Take 0.5 tablets (20 mg total) by mouth daily.    Dispense:  90 tablet    Refill:  3  . DISCONTD: hydrochlorothiazide (HYDRODIURIL) 25 MG tablet    Sig: Take 1 tablet (25 mg total) by mouth daily.    Dispense:  90 tablet    Refill:  3  . DISCONTD: metFORMIN (GLUCOPHAGE-XR) 500 MG 24 hr tablet    Sig: Take 1 tablet (500 mg total) by mouth daily with breakfast.    Dispense:  90 tablet    Refill:  3  . DISCONTD: rosuvastatin (CRESTOR) 40 MG tablet    Sig: Take 1 tablet (40 mg total) by mouth daily.    Dispense:  90 tablet    Refill:  3  . hydrochlorothiazide (HYDRODIURIL) 25 MG tablet    Sig: Take 1 tablet (25 mg total) by mouth daily.    Dispense:  90 tablet    Refill:  3  . metFORMIN (GLUCOPHAGE-XR) 500 MG 24 hr tablet    Sig: Take 1 tablet (500 mg total) by mouth daily with breakfast.    Dispense:  90 tablet    Refill:  3  . rosuvastatin (CRESTOR) 40 MG tablet    Sig: Take 1 tablet (40 mg total) by mouth daily.    Dispense:  90 tablet    Refill:  3   Medications Discontinued During This Encounter  Medication Reason  . rosuvastatin (CRESTOR) 5 MG tablet Completed Course  . pravastatin (PRAVACHOL) 40 MG tablet Completed Course  . ibuprofen (ADVIL,MOTRIN) 600 MG tablet Completed Course  . HYDROcodone-acetaminophen (NORCO) 5-325 MG tablet Completed Course  . lisinopril (PRINIVIL,ZESTRIL) 20 MG tablet Reorder  . hydrochlorothiazide (HYDRODIURIL) 25 MG tablet Reorder  . metFORMIN (GLUCOPHAGE-XR) 500 MG 24 hr tablet Reorder  . rosuvastatin (CRESTOR) 40 MG tablet Reorder  . hydrochlorothiazide (HYDRODIURIL) 25 MG tablet Reorder  . metFORMIN (GLUCOPHAGE-XR) 500 MG 24 hr tablet Reorder  . rosuvastatin (CRESTOR) 40 MG tablet Reorder   No orders of the defined types were placed in this encounter.   Signed,  [name]. Wray Goehring, MD   Allergies  as of 02/08/2017      Reactions   Naproxen    REACTION: Rash   Bextra [valdecoxib] Rash   Metaxalone Rash   REACTION: Rash  Medication List       Accurate as of 02/08/17  8:42 AM. Always use your most recent med list.          hydrochlorothiazide 25 MG tablet Commonly known as:  HYDRODIURIL Take 1 tablet (25 mg total) by mouth daily.   lisinopril 40 MG tablet Commonly known as:  PRINIVIL,ZESTRIL Take 0.5 tablets (20 mg total) by mouth daily.   metFORMIN 500 MG 24 hr tablet Commonly known as:  GLUCOPHAGE-XR Take 1 tablet (500 mg total) by mouth daily with breakfast.   multivitamin with minerals Tabs tablet Take 1 tablet by mouth daily.   ONE TOUCH ULTRA TEST test strip Generic drug:  glucose blood Use to check blood sugar  two times a day   rosuvastatin 40 MG tablet Commonly known as:  CRESTOR Take 1 tablet (40 mg total) by mouth daily.

## 2017-02-17 IMAGING — MG MM DIGITAL SCREENING BILAT W/ CAD
4 series · 4 of 4 positions shown · non-contrast
Comparison: Previous exam(s).

CLINICAL DATA: Screening.

EXAM:
DIGITAL SCREENING BILATERAL MAMMOGRAM WITH CAD

[L CC]
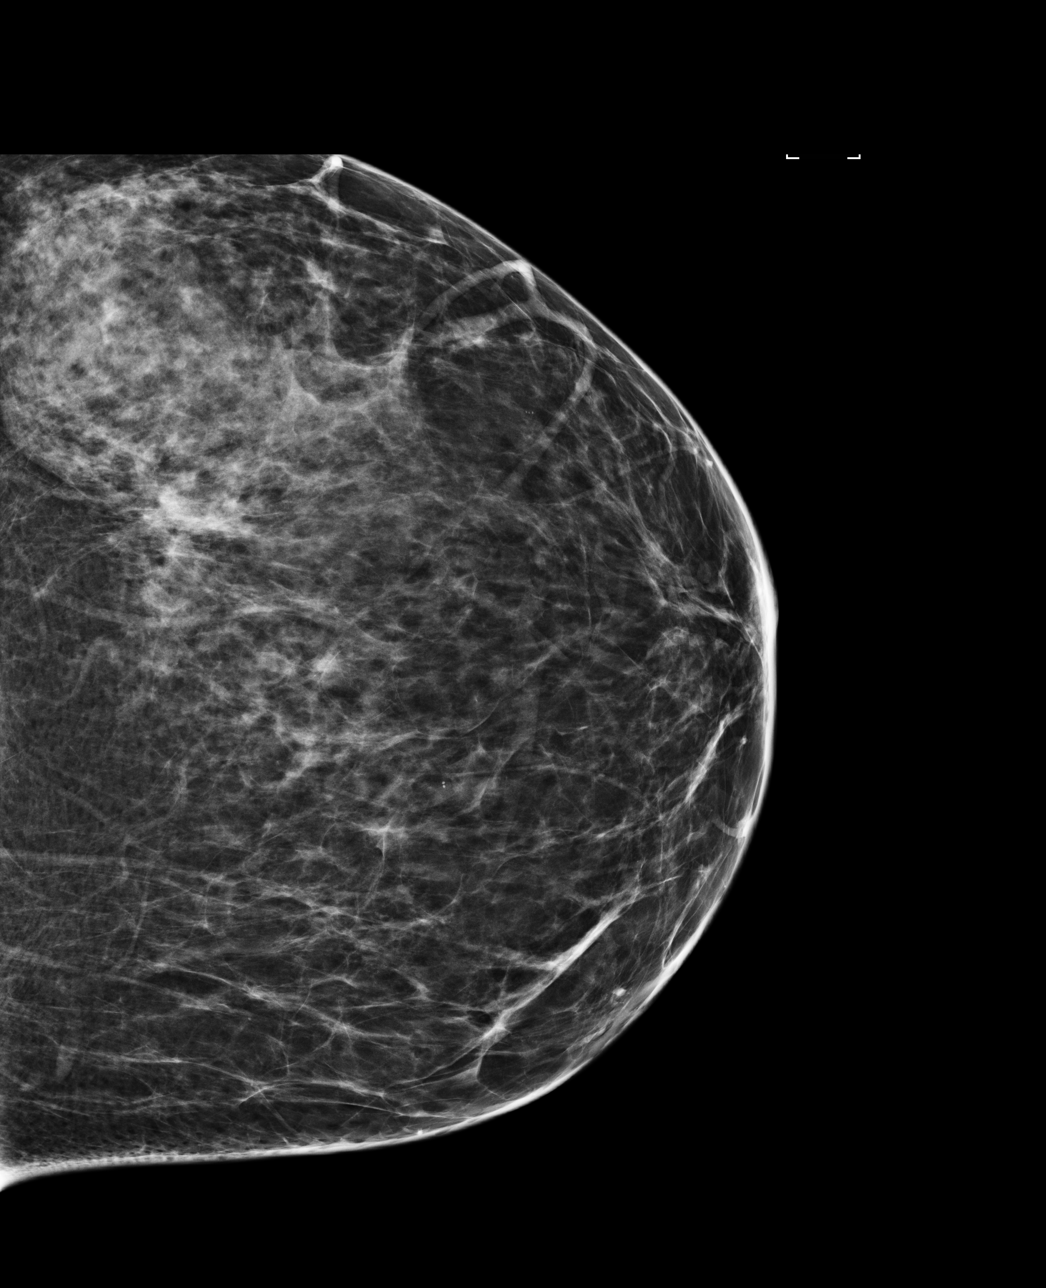

[R CC]
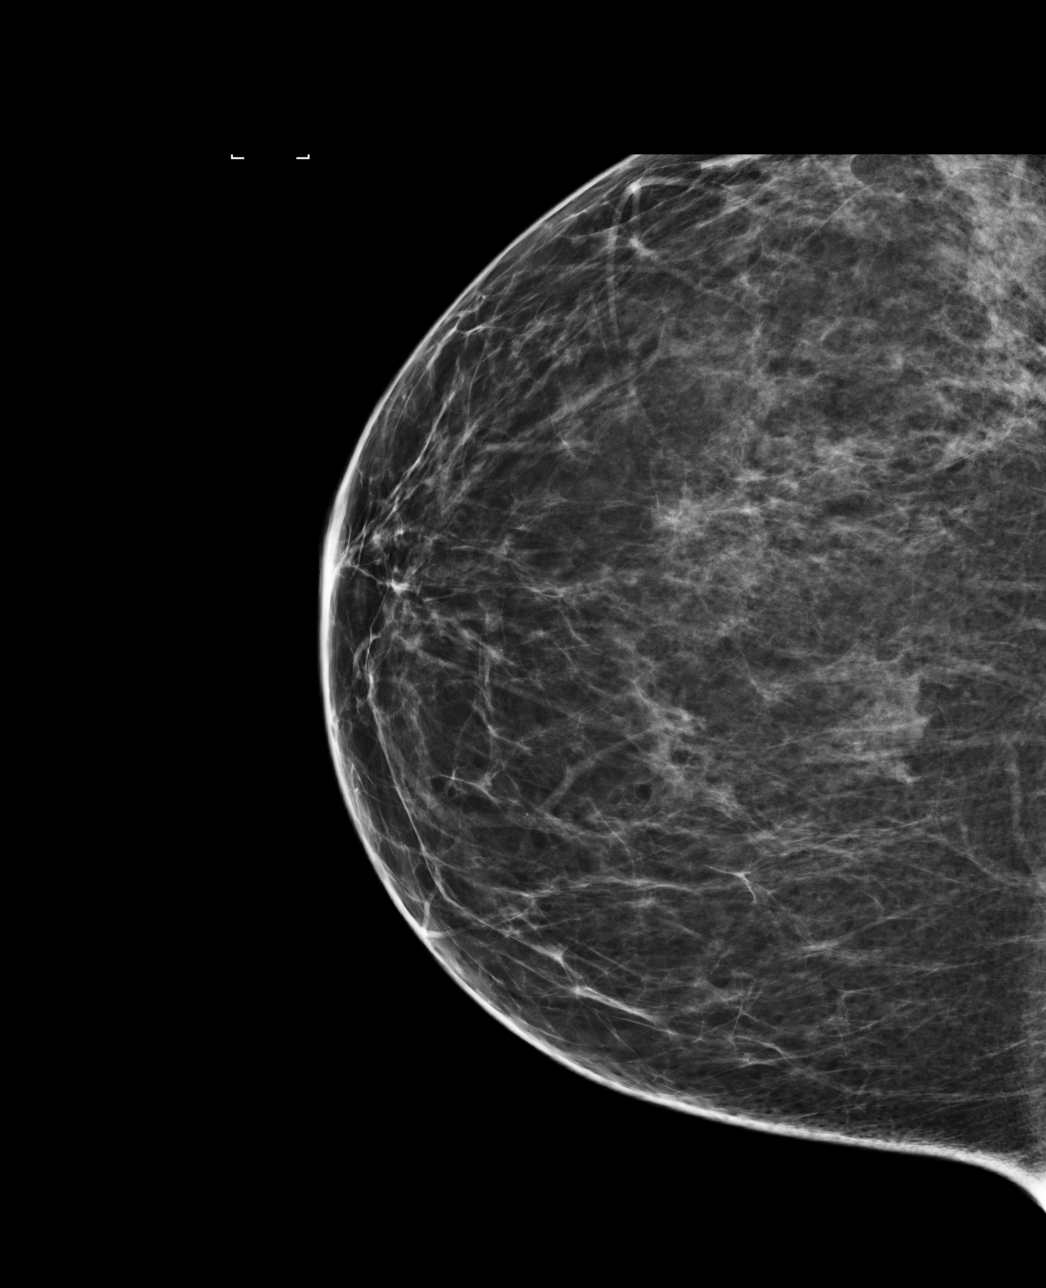

[L MLO]
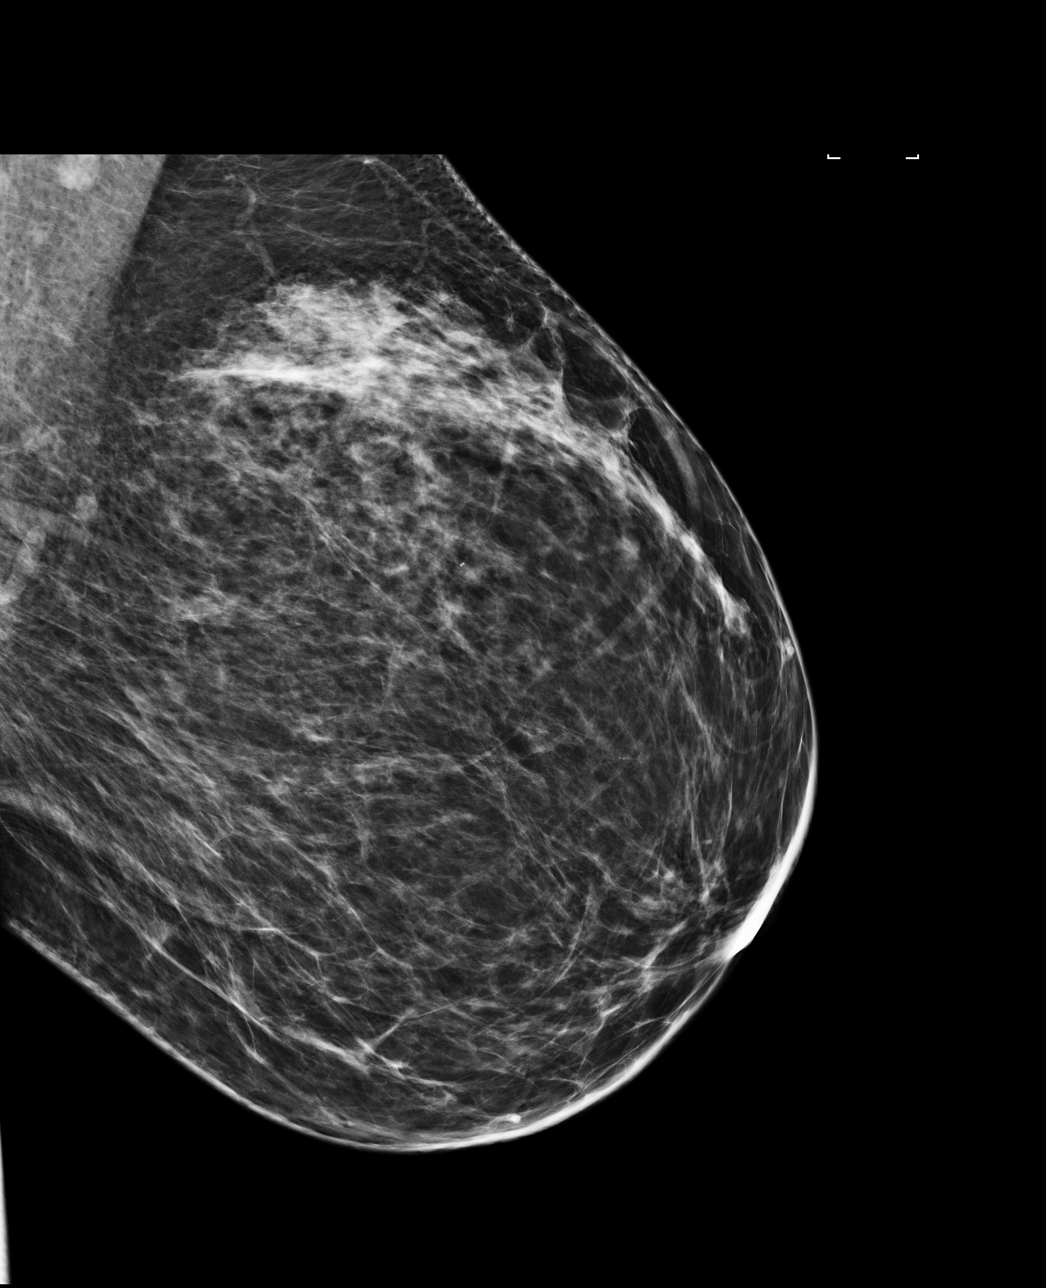

[R MLO]
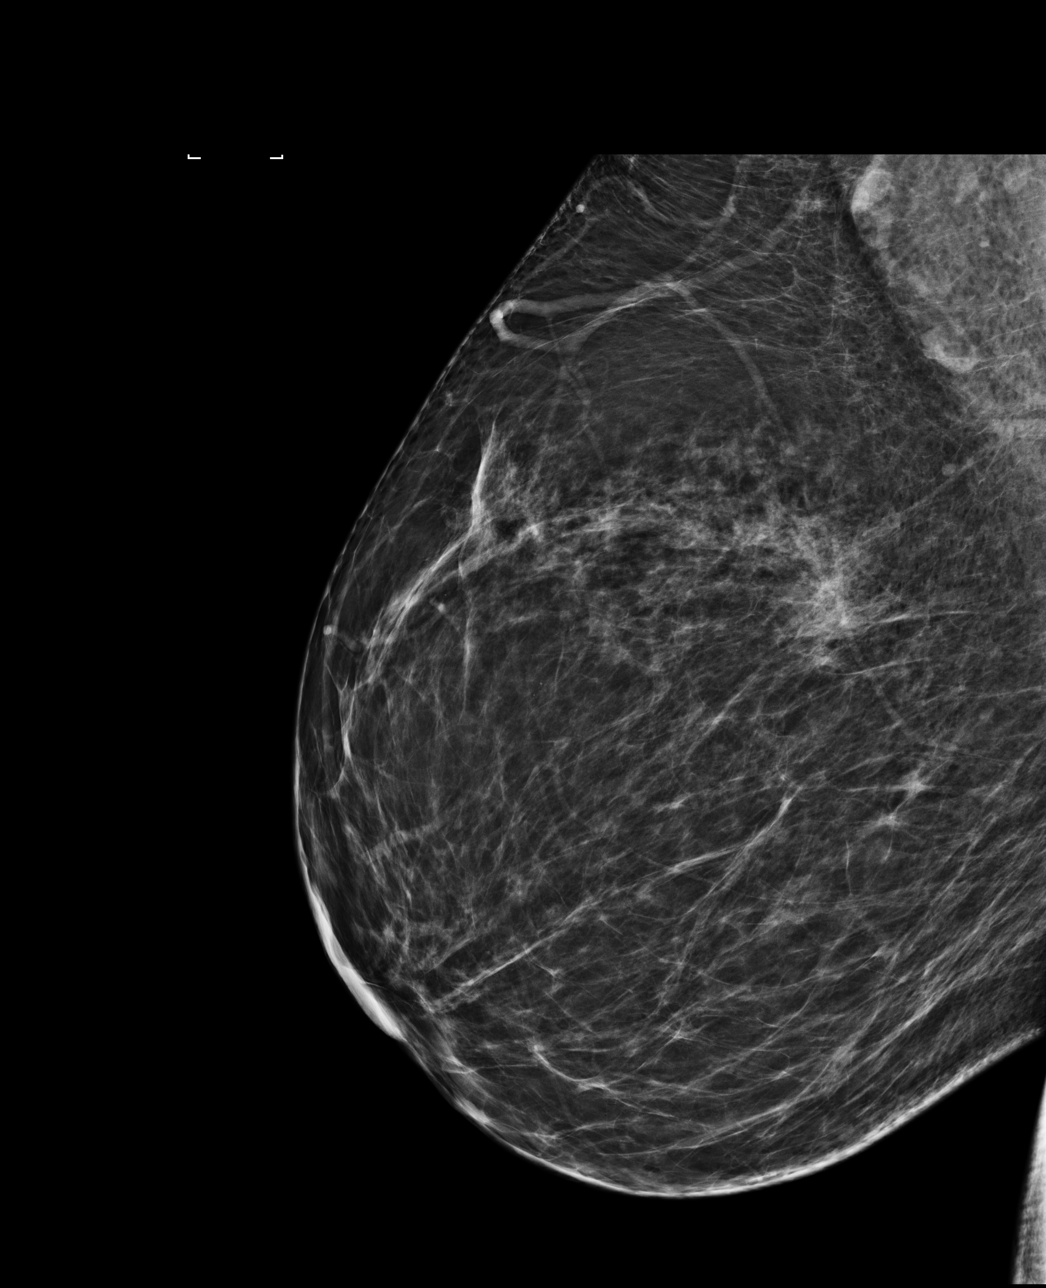

[4 of 4 positions shown; findings below may reference images not displayed]

ACR Breast Density Category b: There are scattered areas of
fibroglandular density.
FINDINGS: There are no findings suspicious for malignancy. Images were
processed with CAD.
IMPRESSION: No mammographic evidence of malignancy. A result letter of this
screening mammogram will be mailed directly to the patient.

RECOMMENDATION:
Screening mammogram in one year. (Code:AS-G-LCT)

BI-RADS CATEGORY  1: Negative.

## 2017-02-23 ENCOUNTER — Other Ambulatory Visit: Payer: Self-pay | Admitting: Family Medicine

## 2017-03-11 ENCOUNTER — Other Ambulatory Visit: Payer: Self-pay | Admitting: Family Medicine

## 2017-03-11 DIAGNOSIS — Z1231 Encounter for screening mammogram for malignant neoplasm of breast: Secondary | ICD-10-CM

## 2017-04-23 ENCOUNTER — Ambulatory Visit
Admission: RE | Admit: 2017-04-23 | Discharge: 2017-04-23 | Disposition: A | Discharge status: Home / Self Care | Payer: 59 | Source: Ambulatory Visit | Attending: Family Medicine | Admitting: Family Medicine

## 2017-04-23 DIAGNOSIS — Z1231 Encounter for screening mammogram for malignant neoplasm of breast: Secondary | ICD-10-CM | POA: Diagnosis present

## 2017-05-31 ENCOUNTER — Ambulatory Visit: Payer: Self-pay | Admitting: *Deleted

## 2017-05-31 ENCOUNTER — Telehealth: Payer: Self-pay | Admitting: *Deleted

## 2017-05-31 ENCOUNTER — Other Ambulatory Visit: Payer: Self-pay | Admitting: *Deleted

## 2017-05-31 MED ORDER — LISINOPRIL 40 MG PO TABS: 40.0000 mg | ORAL_TABLET | Freq: Every day | ORAL | 3 refills | Status: DC

## 2017-05-31 NOTE — Telephone Encounter (Signed)
Pt   Was   Seen  On   02/08/2017   was  rx   lisinopril   20  Mg   Daily     With  3  Refills   And     Doctors  Notes   States  To  Take  40  Mg  Daily. She  States  She  Is going  To  Run out  Of  Her  meds  Soon  As   She  Has  Been taking  Two  Of  The  20 mg  Pills  Daily .  Could you  Please  Refill   And  Update script ?

## 2017-05-31 NOTE — Telephone Encounter (Signed)
Copied from CRM #[id number]. Topic: Quick Communication - See Telephone Encounter >> May 31, 2017 10:11 AM Eston Mouldavis, Cheri B wrote: CRM for notification. See Telephone encounter for:  05/31/17.

## 2017-05-31 NOTE — Telephone Encounter (Signed)
Copied from CRM #[id number]. Topic: Quick Communication - See Telephone Encounter >> May 31, 2017 10:11 AM Ashaya Raftery B wrote: CRM for notification. See Telephone encounter for:  05/31/17. 

## 2017-05-31 NOTE — Telephone Encounter (Signed)
[facility] to [facility], 40 mg daily, 90, 3 ref

## 2017-05-31 NOTE — Telephone Encounter (Signed)
Pt is requesting refill of BP med

## 2017-05-31 NOTE — Telephone Encounter (Signed)
Please refill per protocol.

## 2017-05-31 NOTE — Addendum Note (Signed)
Addended by: Hannah BeatOPLAND, Landis Dowdy on: 05/31/2017 01:39 PM   Modules accepted: Orders

## 2017-05-31 NOTE — Telephone Encounter (Signed)
Per Denny PeonErin [facility]

## 2017-09-25 IMAGING — CT CT ABD-PEL WO/W CM
1 of 4 series · 8 of 32 positions shown, 13 images · IV contrast (APPLIED)
Comparison: None.

CLINICAL DATA: Recent UTI with micro hematuria.

EXAM:
CT ABDOMEN AND PELVIS WITHOUT AND WITH CONTRAST
TECHNIQUE: Multidetector CT imaging of the abdomen and pelvis was performed
following the standard protocol before and following the bolus
administration of intravenous contrast.
CONTRAST:  125 cc Isovue 370

[Series 6: axial post · axial · 0.79mm/px · z∈[-1179,-759]mm · 8 of 108 slices shown, 13 images]
[im 12/108  soft-tissue]
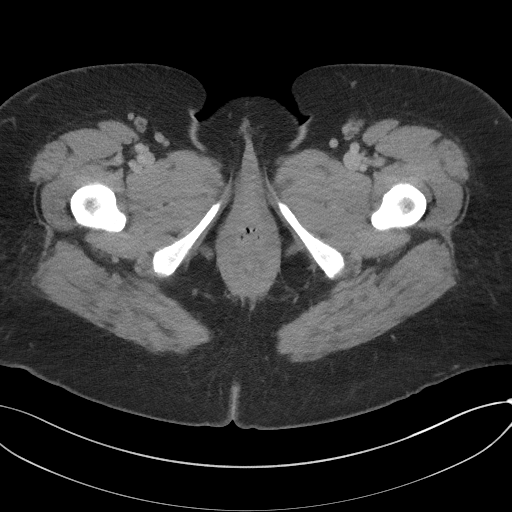
[im 12/108  bone]
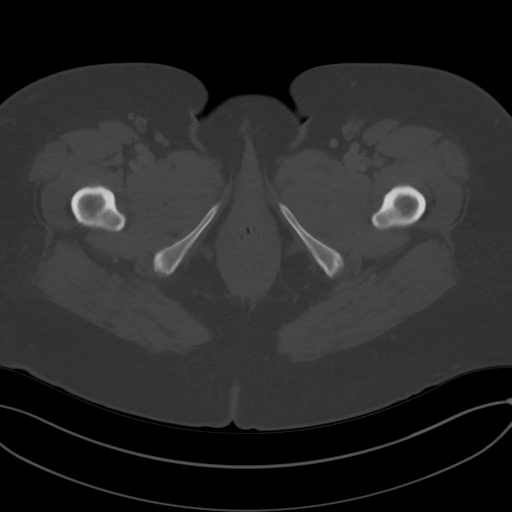
[im 24/108  soft-tissue]
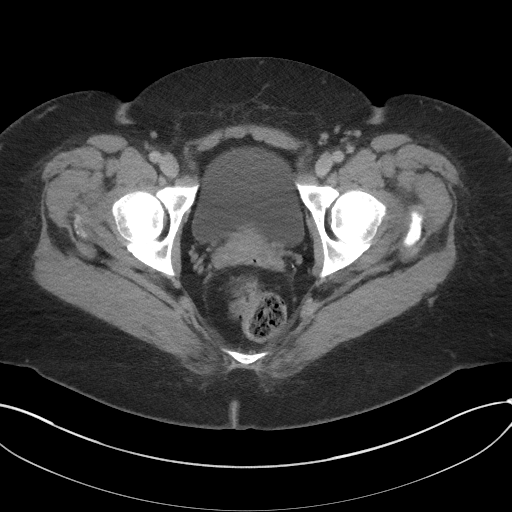
[im 36/108  soft-tissue]
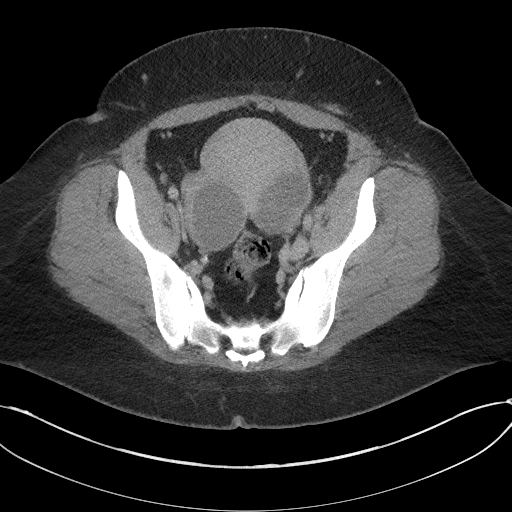
[im 48/108  soft-tissue]
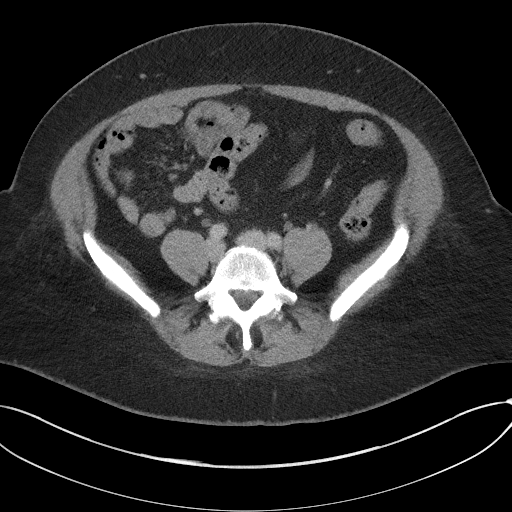
[im 60/108  soft-tissue]
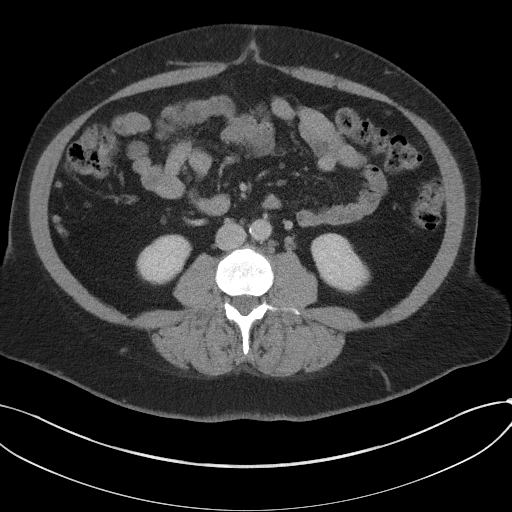
[im 60/108  lung]
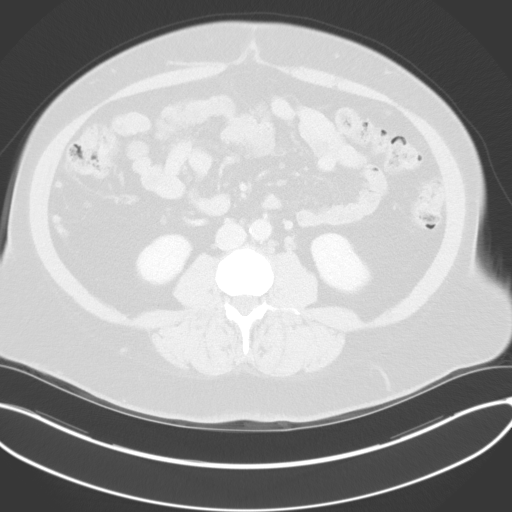
[im 72/108  soft-tissue]
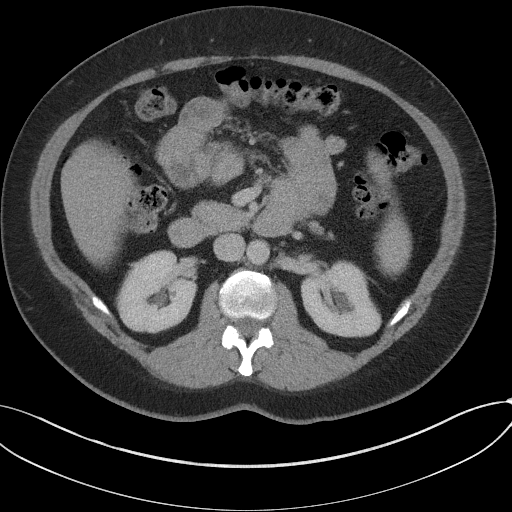
[im 72/108  lung]
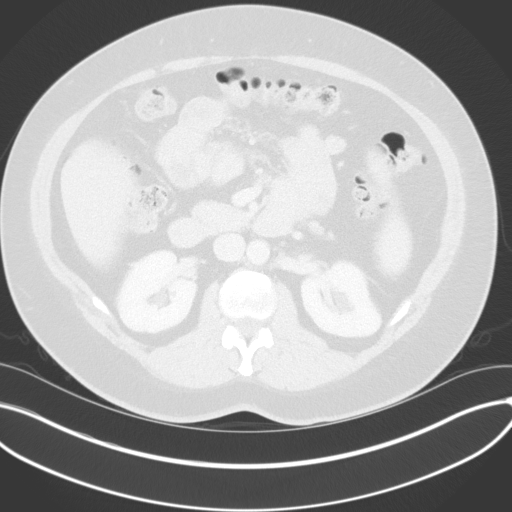
[im 84/108  soft-tissue]
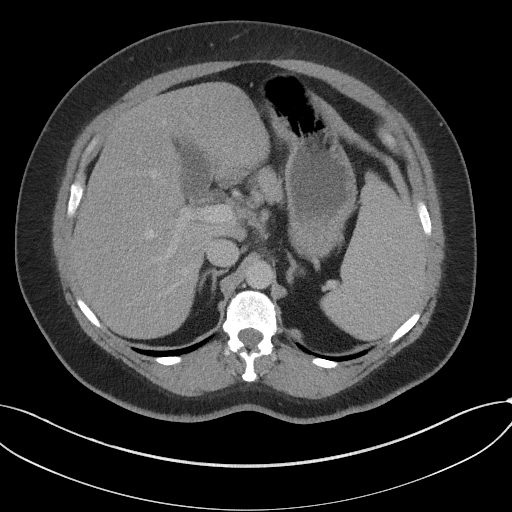
[im 84/108  lung]
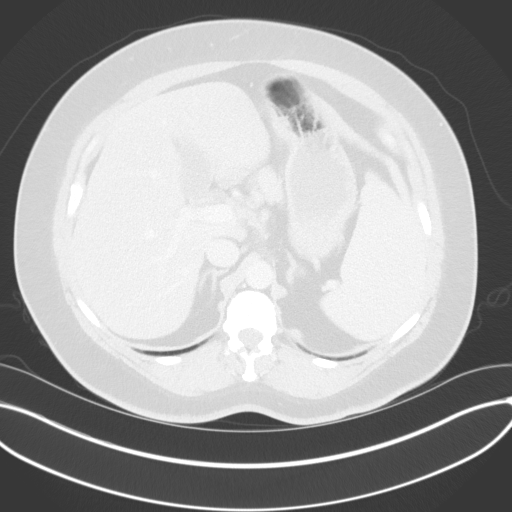
[im 96/108  soft-tissue]
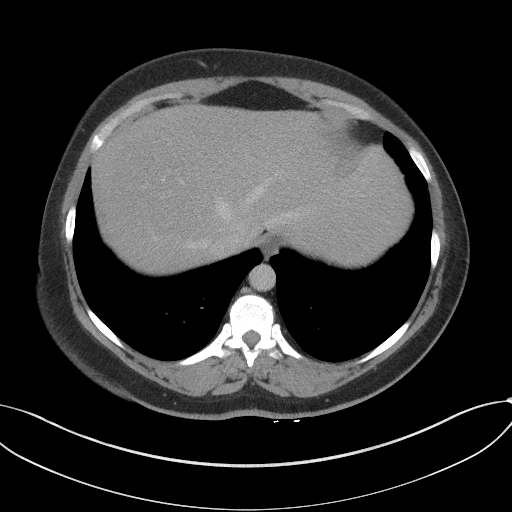
[im 96/108  lung]
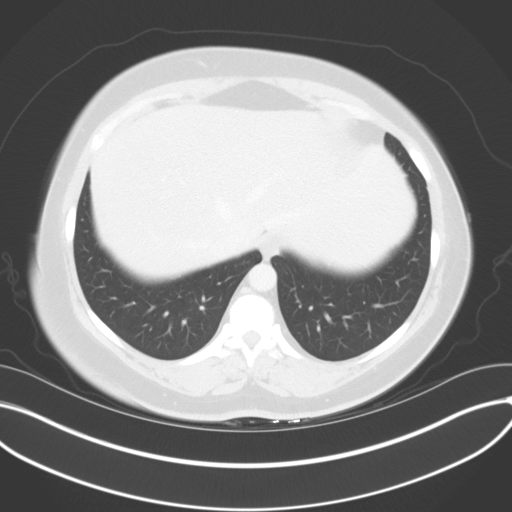

[8 of 32 positions shown; findings below may reference images not displayed]

FINDINGS: Lower chest:  Unremarkable.

Hepatobiliary: No focal abnormality within the liver parenchyma.
There is no evidence for gallstones, gallbladder wall thickening, or
pericholecystic fluid. No intrahepatic or extrahepatic biliary
dilation.

Pancreas: No focal mass lesion. No dilatation of the main duct. No
intraparenchymal cyst. No peripancreatic edema.

Spleen: No splenomegaly. No focal mass lesion.

Adrenals/Urinary Tract: No adrenal nodule or mass. Right kidney is
unremarkable. 18 x 8 x 17 mm stone is identified in the left renal
pelvis. For for 5 additional tiny stones are clustered in the left
lower pole collecting system, ranging in size from 1-2 mm up to 4
mm. No ureteral or bladder stones.

Imaging after intravenous contrast administration shows no enhancing
lesion in either kidney. Delayed imaging shows good opacification of
the intrarenal collecting systems bilaterally. Left renal stones are
again noted, but otherwise no filling defect within the opacified
intrarenal collecting system or renal pelvis of either kidney. Right
ureter is well opacified and has normal imaging features. Proximal
[DATE] of the left ureter is not opacified, but left ureter is
otherwise unremarkable. Delayed imaging through the bladder shows no
focal bladder wall abnormality.

Stomach/Bowel: Stomach is nondistended. No gastric wall thickening.
No evidence of outlet obstruction. Small duodenum diverticulum
noted. No small bowel wall thickening. No small bowel dilatation.
The terminal ileum is normal. The appendix is normal. No gross
colonic mass. No colonic wall thickening. No substantial
diverticular change.

Vascular/Lymphatic: There is no gastrohepatic or hepatoduodenal
ligament lymphadenopathy. No intraperitoneal or retroperitoneal
lymphadenopathy. Small lymph nodes are evident in the hepatoduodenal
ligament. No pelvic sidewall lymphadenopathy.

Reproductive: Uterus is unremarkable. 3.7 x 5.7 x 4.6 cm cystic mass
is identified in the left ovary. 4.2 x 7.8 x 8.4 cm cystic mass is
identified in the right ovary.

Other: No intraperitoneal free fluid.

Musculoskeletal: Sclerotic changes are seen at the symphysis pubis
and SI joints bilaterally. Bone windows reveal no worrisome lytic or
sclerotic osseous lesions.
IMPRESSION: 1. 18 x 8 x 17 mm nonobstructing stone identified in the left renal
pelvis with clustered tiny stones in the left lower pole collecting
systems. Otherwise unremarkable CT evaluation of the urinary tract.
2. Bilateral cystic ovarian masses. Based on the size of these
lesions, if the patient is premenopausal, follow-up ultrasound exam
in 6-12 weeks is recommended to assess for resolution. If the
patient is postmenopausal, ultrasound exam is recommended at this
time to further characterize. This recommendation follows ACR
consensus guidelines: White Paper of the ACR Incidental Findings
Committee II on Adnexal Findings. [HOSPITAL] [DATE].

## 2017-10-31 ENCOUNTER — Other Ambulatory Visit: Payer: Self-pay | Admitting: Family Medicine

## 2017-12-20 ENCOUNTER — Telehealth: Payer: Self-pay

## 2017-12-20 ENCOUNTER — Ambulatory Visit
Admission: RE | Admit: 2017-12-20 | Discharge: 2017-12-20 | Disposition: A | Discharge status: Home / Self Care | Payer: Managed Care, Other (non HMO) | Source: Ambulatory Visit | Attending: Urology | Admitting: Urology

## 2017-12-20 DIAGNOSIS — Z87442 Personal history of urinary calculi: Secondary | ICD-10-CM | POA: Insufficient documentation

## 2017-12-20 NOTE — Telephone Encounter (Signed)
Left pt mess to call 

## 2017-12-20 NOTE — Telephone Encounter (Signed)
-----   Message from Harle Battiest, PA-C sent at 12/20/2017 10:04 AM EDT ----- Please let Mrs. Tison know that her x-ray did not demonstrate any stones.

## 2018-01-25 ENCOUNTER — Encounter: Payer: Self-pay | Admitting: Family Medicine

## 2018-02-01 NOTE — Telephone Encounter (Signed)
°  Relation to pt: self Call back number: [phone] (M)   Reason for call:  Patient checking on the status of my chart message, please advise

## 2018-02-02 MED ORDER — NONFORMULARY OR COMPOUNDED ITEM: 0 refills | Status: DC

## 2018-02-14 ENCOUNTER — Other Ambulatory Visit: Payer: Self-pay | Admitting: Family Medicine

## 2018-02-15 LAB — CBC WITH DIFFERENTIAL/PLATELET
BASOS: 0 %
Basophils Absolute: 0 10*3/uL (ref 0.0–0.2)
EOS (ABSOLUTE): 0.3 10*3/uL (ref 0.0–0.4)
EOS: 3 %
Hematocrit: 45.7 % (ref 34.0–46.6)
Hemoglobin: 15.6 g/dL (ref 11.1–15.9)
IMMATURE GRANULOCYTES: 0 %
Immature Grans (Abs): 0 10*3/uL (ref 0.0–0.1)
Lymphocytes Absolute: 3.9 10*3/uL — ABNORMAL HIGH (ref 0.7–3.1)
Lymphs: 41 %
MCH: 28.3 pg (ref 26.6–33.0)
MCHC: 34.1 g/dL (ref 31.5–35.7)
MCV: 83 fL (ref 79–97)
MONOS ABS: 0.8 10*3/uL (ref 0.1–0.9)
Monocytes: 9 %
NEUTROS PCT: 47 %
Neutrophils Absolute: 4.6 10*3/uL (ref 1.4–7.0)
Platelets: 259 10*3/uL (ref 150–450)
RBC: 5.51 x10E6/uL — AB (ref 3.77–5.28)
RDW: 14.4 % (ref 12.3–15.4)
WBC: 9.6 10*3/uL (ref 3.4–10.8)

## 2018-02-15 LAB — MEASLES/MUMPS/RUBELLA IMMUNITY
MUMPS ABS, IGG: 82.7 [AU]/ml (ref >10.9)
Rubella Antibodies, IGG: 3.07 index (ref >0.99)

## 2018-02-15 LAB — MICROALBUMIN / CREATININE URINE RATIO
CREATININE, UR: 229.7 mg/dL
MICROALB/CREAT RATIO: 5.8 mg/g{creat} (ref 0.0–30.0)
Microalbumin, Urine: 13.3 ug/mL

## 2018-02-15 LAB — BASIC METABOLIC PANEL
BUN/Creatinine Ratio: 23 (ref 9–23)
BUN: 19 mg/dL (ref 6–24)
CALCIUM: 10.3 mg/dL — AB (ref 8.7–10.2)
CO2: 25 mmol/L (ref 20–29)
Chloride: 98 mmol/L (ref 96–106)
Creatinine, Ser: 0.84 mg/dL (ref 0.57–1.00)
GFR, EST AFRICAN AMERICAN: 94 mL/min/{1.73_m2} (ref >59)
GFR, EST NON AFRICAN AMERICAN: 81 mL/min/{1.73_m2} (ref >59)
Glucose: 186 mg/dL — ABNORMAL HIGH (ref 65–99)
Potassium: 4.9 mmol/L (ref 3.5–5.2)
Sodium: 142 mmol/L (ref 134–144)

## 2018-02-15 LAB — SPECIMEN STATUS REPORT

## 2018-02-15 LAB — HGB A1C W/O EAG: HEMOGLOBIN A1C: 7.8 % — AB (ref 4.8–5.6)

## 2018-02-23 ENCOUNTER — Encounter: Payer: Self-pay | Admitting: Family Medicine

## 2018-02-23 ENCOUNTER — Ambulatory Visit (INDEPENDENT_AMBULATORY_CARE_PROVIDER_SITE_OTHER): Payer: Managed Care, Other (non HMO) | Admitting: Family Medicine

## 2018-02-23 VITALS — BP 136/70 | HR 92 | Temp 97.7°F | Ht 68.0 in | Wt 269.5 lb

## 2018-02-23 DIAGNOSIS — Z0000 Encounter for general adult medical examination without abnormal findings: Secondary | ICD-10-CM | POA: Diagnosis not present

## 2018-02-23 DIAGNOSIS — Z13220 Encounter for screening for lipoid disorders: Secondary | ICD-10-CM

## 2018-02-23 LAB — HM PAP SMEAR: HM Pap smear: NORMAL

## 2018-02-23 MED ORDER — METFORMIN HCL ER 500 MG PO TB24: 1000.0000 mg | ORAL_TABLET | Freq: Every day | ORAL | 3 refills | Status: DC

## 2018-02-23 MED ORDER — HYDROCHLOROTHIAZIDE 25 MG PO TABS: 25.0000 mg | ORAL_TABLET | Freq: Every day | ORAL | 3 refills | Status: DC

## 2018-02-23 MED ORDER — LISINOPRIL 40 MG PO TABS: 40.0000 mg | ORAL_TABLET | Freq: Every day | ORAL | 3 refills | Status: DC

## 2018-02-23 MED ORDER — ONETOUCH ULTRA BLUE VI STRP: ORAL_STRIP | 2 refills | Status: DC

## 2018-02-23 NOTE — Addendum Note (Signed)
Addended by: Eual FinesBRIDGES, Melannie Metzner P on: 02/23/2018 09:01 AM   Modules accepted: Orders

## 2018-02-23 NOTE — Progress Notes (Signed)
Dr. Frederico Hamman T. Copland, MD, [facility] [place], [address] Phone: [phone] Fax: [phone]  02/23/2018  Patient: Joyce Rowe, MRN: [id number], DOB: 29-Jul-1966, 52 y.o.  Primary Physician:  Owens Loffler, MD   Chief Complaint  Patient presents with  . Annual Exam   Subjective:   Joyce Rowe is a 52 y.o. pleasant patient who presents with the following:  Health Maintenance Summary Reviewed and updated, unless pt declines services.  Tobacco History Reviewed. Non-smoker Alcohol: No concerns, no excessive use Exercise Habits: Some activity, rec at least 30 mins 5 times a week STD concerns: none Drug Use: None Birth control method: Menses regular: irregular Lumps or breast concerns: no Breast Cancer Family History: no  Colon - wants cologuard Pap - due today. Breast - mammo utd Foot  Lab draw for lipids.  7.8 a1c  Diabetes Mellitus: Tolerating Medications: yes Compliance with diet: fair Exercise: minimal / intermittent Avg blood sugars at home: not checking Foot problems: none Hypoglycemia: none No nausea, vomitting, blurred vision, polyuria.  Lab Results  Component Value Date   HGBA1C 7.8 (H) 02/14/2018   HGBA1C 6.7 (H) 12/18/2016   HGBA1C 6.1 (H) 02/06/2016   Lab Results  Component Value Date   LDLCALC 73 12/18/2016   CREATININE 0.84 02/14/2018    Wt Readings from Last 3 Encounters:  02/23/18 269 lb 8 oz (122.2 kg)  02/08/17 268 lb 8 oz (121.8 kg)  01/26/17 270 lb 14.4 oz (122.9 kg)    Body mass index is 40.98 kg/m.    Health Maintenance  Topic Date Due  . HIV Screening  01/05/1983  . FOOT EXAM  02/09/2017  . PAP SMEAR  03/28/2017  . COLONOSCOPY  01/04/2018  . OPHTHALMOLOGY EXAM  02/08/2018  . INFLUENZA VACCINE  03/10/2018  . MAMMOGRAM  04/23/2018  . HEMOGLOBIN A1C  08/17/2018  . PNEUMOCOCCAL POLYSACCHARIDE VACCINE (2) 05/12/2020  . TETANUS/TDAP  03/28/2024     Immunization History  Administered Date(s) Administered  . Influenza,inj,Quad PF,6+ Mos 05/13/2015  . Pneumococcal Polysaccharide-23 05/13/2015  . Tdap 03/28/2014   Patient Active Problem List   Diagnosis Date Noted  . Kidney stones 12/28/2015  . Cysts of both ovaries 12/28/2015  . Microscopic hematuria 11/12/2015  . Essential hypertension 08/21/2015  . Diabetes mellitus type 2, uncontrolled, without complications ([place]) 44/92/0100  . Hyperlipidemia LDL goal <70 03/28/2014  . Morbid obesity with BMI of 40.0-44.9, adult ([place]) 03/28/2014   Past Medical History:  Diagnosis Date  . Diabetes mellitus type 2, uncontrolled, without complications ([place]) 02/18/1974  . Hyperlipidemia   . Kidney stone   . Microscopic hematuria   . Morbid obesity with BMI of 40.0-44.9, adult ([facility]) 03/28/2014  . Unspecified essential hypertension    Past Surgical History:  Procedure Laterality Date  . CESAREAN SECTION  08/2002  . EXTRACORPOREAL SHOCK WAVE LITHOTRIPSY Left 12/12/2015   Procedure: EXTRACORPOREAL SHOCK WAVE LITHOTRIPSY (ESWL);  Surgeon: Hollice Espy, MD;  Location: [facility] ORS;  Service: Urology;  Laterality: Left;  . LAPAROSCOPIC BILATERAL SALPINGECTOMY Bilateral 02/27/2016   Procedure: LAPAROSCOPIC BILATERAL SALPINGECTOMY;  Surgeon: Will Bonnet, MD;  Location: [facility];  Service: Gynecology;  Laterality: Bilateral;  . LAPAROSCOPIC OVARIAN CYSTECTOMY Right 02/27/2016   Procedure: LAPAROSCOPIC OVARIAN CYSTECTOMY;  Surgeon: Will Bonnet, MD;  Location: [facility] ORS;  Service: Gynecology;  Laterality: Right;  . tubes in ears     70's   Social History   Socioeconomic History  .  Marital status: Married    Spouse name: Not on file  . Number of children: 1  . Years of education: Not on file  . Highest education level: Not on file  Occupational History  . 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 Financial resource strain: Not on file  . Food insecurity:    Worry: Not on file    Inability:  Not on file  . Transportation needs:    Medical: Not on file    Non-medical: Not on file  Tobacco Use  . Smoking status: Never Smoker  . Smokeless tobacco: Never Used  Substance and Sexual Activity  . Alcohol use: No  . Drug use: No  . Sexual activity: Not on file  Lifestyle  . Physical activity:    Days per week: Not on file    Minutes per session: Not on file  . Stress: Not on file  Relationships  . Social connections:    Talks on phone: Not on file    Gets together: Not on file    Attends religious service: Not on file    Active member of club or organization: Not on file    Attends meetings of clubs or organizations: Not on file    Relationship status: Not on file  . Intimate partner violence:    Fear of current or ex partner: Not on file    Emotionally abused: Not on file    Physically abused: Not on file    Forced sexual activity: Not on file  Other Topics Concern  . Not on file  Social History Narrative   Married      1 child      BS, SAHM      6-8 hours sleep per night      3 people living in home      Regular exercise-yes   Family History  Problem Relation Age of Onset  . Hypertension Mother   . Hypertension Father   . Hematuria Father   . Colon cancer Unknown        Grandparents  <60  . Heart disease Unknown        Grandparents  . Diabetes Maternal Grandmother   . Breast cancer Neg Hx   . Kidney disease Neg Hx   . Bladder Cancer Neg Hx   . Kidney cancer Neg Hx    Allergies  Allergen Reactions  . Naproxen     REACTION: Rash  . Bextra [Valdecoxib] Rash  . Metaxalone Rash    REACTION: Rash    Medication list has been reviewed and updated.   General: Denies fever, chills, sweats. No significant weight loss. Eyes: Denies blurring,significant itching ENT: Denies earache, sore throat, and hoarseness.  Cardiovascular: Denies chest pains, palpitations, dyspnea on exertion,  Respiratory: Denies cough, dyspnea at rest,wheeezing Breast: no  concerns about lumps GI: Denies nausea, vomiting, diarrhea, constipation, change in bowel habits, abdominal pain, melena, hematochezia GU: Denies dysuria, hematuria, urinary hesitancy, nocturia, denies STD risk, no concerns about discharge Musculoskeletal: Denies back pain, joint pain Derm: Denies rash, itching Neuro: Denies  paresthesias, frequent falls, frequent headaches Psych: Denies depression, anxiety Endocrine: Denies cold intolerance, heat intolerance, polydipsia Heme: Denies enlarged lymph nodes Allergy: No hayfever  Objective:   BP 136/70   Pulse 92   Temp 97.7 F (36.5 C) (Oral)   Ht 5' 8" (1.727 m)   Wt 269 lb 8 oz (122.2 kg)   LMP 01/06/2018   BMI 40.98 kg/m  No exam  data present  GEN: well developed, well nourished, no acute distress Eyes: conjunctiva and lids normal, PERRLA, EOMI ENT: TM clear, nares clear, oral exam WNL Neck: supple, no lymphadenopathy, no thyromegaly, no JVD Pulm: clear to auscultation and percussion, respiratory effort normal CV: regular rate and rhythm, S1-S2, no murmur, rub or gallop, no bruits Chest: no scars, masses, no lumps BREAST: breast exam declined GI: soft, non-tender; no hepatosplenomegaly, masses; active bowel sounds all quadrants GU: GU exam declined Lymph: no cervical, axillary or inguinal adenopathy MSK: gait normal, muscle tone and strength WNL, no joint swelling, effusions, discoloration, crepitus  SKIN: clear, good turgor, color WNL, no rashes, lesions, or ulcerations Neuro: normal mental status, normal strength, sensation, and motion Psych: alert; oriented to person, place and time, normally interactive and not anxious or depressed in appearance.   All labs reviewed with patient. Lipids:    Component Value Date/Time   CHOL 172 12/18/2016 0925   TRIG 248 (H) 12/18/2016 0925   HDL 49 12/18/2016 0925   CHOLHDL 3.5 12/18/2016 0925   CBC: CBC Latest Ref Rng & Units 02/14/2018 12/18/2016 02/06/2016  WBC 3.4 - 10.8  x10E3/uL 9.6 8.2 8.4  Hemoglobin 11.1 - 15.9 g/dL 15.6 14.6 15.2  Hematocrit 34.0 - 46.6 % 45.7 43.0 43.6  Platelets 150 - 450 x10E3/uL 259 276 831    Basic Metabolic Panel:    Component Value Date/Time   NA 142 02/14/2018 0713   K 4.9 02/14/2018 0713   CL 98 02/14/2018 0713   CO2 25 02/14/2018 0713   BUN 19 02/14/2018 0713   CREATININE 0.84 02/14/2018 0713   GLUCOSE 186 (H) 02/14/2018 0713   CALCIUM 10.3 (H) 02/14/2018 0713   Hepatic Function Latest Ref Rng & Units 12/18/2016 02/06/2016 03/22/2015  Total Protein 6.0 - 8.5 g/dL 6.9 7.1 6.7  Albumin 3.5 - 5.5 g/dL 4.2 4.4 4.0  AST 0 - 40 IU/L _0 ALT 0 - 32 IU/L _1 Alk Phosphatase 39 - 117 IU/L 60 58 63  Total Bilirubin 0.0 - 1.2 mg/dL 0.5 0.5 0.5  Bilirubin, Direct 0.00 - 0.40 mg/dL 0.11 0.13 0.12    Lab Results  Component Value Date   TSH 2.170 12/18/2016   No results found.  Assessment and Plan:   Routine general medical examination at a health care facility  Screening, lipid - Plan: Lipid panel  [facility] left off lipid panel - will add  Pap and breast today  Health Maintenance Exam: The patient's preventative maintenance and recommended screening tests for an annual wellness exam were reviewed in full today. Brought up to date unless services declined.  Counselled on the importance of diet, exercise, and its role in overall health and mortality. The patient's FH and SH was reviewed, including their home life, tobacco status, and drug and alcohol status.  Follow-up in 1 year for physical exam or additional follow-up below.  Follow-up: Return in about 6 months (around 08/26/2018). Or follow-up in 1 year if not noted.  Medications Discontinued During This Encounter  Medication Reason  . Multiple Vitamin (MULTIVITAMIN WITH MINERALS) TABS tablet Patient Preference  . hydrochlorothiazide (HYDRODIURIL) 25 MG tablet Reorder  . lisinopril (PRINIVIL,ZESTRIL) 40 MG tablet Reorder  . metFORMIN  (GLUCOPHAGE-XR) 500 MG 24 hr tablet Reorder  . ONE TOUCH ULTRA TEST test strip Reorder   Orders Placed This Encounter  Procedures  . Lipid panel    Signed,  Frederico Hamman T. Armarion Greek, MD   Allergies as of 02/23/2018  Reactions   Naproxen    REACTION: Rash   Bextra [valdecoxib] Rash   Metaxalone Rash   REACTION: Rash      Medication List        Accurate as of 02/23/18  8:54 AM. Always use your most recent med list.          hydrochlorothiazide 25 MG tablet Commonly known as:  HYDRODIURIL Take 1 tablet (25 mg total) by mouth daily.   lisinopril 40 MG tablet Commonly known as:  PRINIVIL,ZESTRIL Take 1 tablet (40 mg total) by mouth daily.   metFORMIN 500 MG 24 hr tablet Commonly known as:  GLUCOPHAGE-XR Take 2 tablets (1,000 mg total) by mouth daily with breakfast.   NONFORMULARY OR COMPOUNDED ITEM Epic Account: [id number] Lab Studies: BMP, CBC with diff, HFP: Z79.899 FLP: E78.5 HbA1c: DM type 2, controlled 119147 - Measles, Mumps & Rubella Urine microalbumin, E11.9   ONE TOUCH ULTRA TEST test strip Generic drug:  glucose blood Use to check blood sugar  two times a day   rosuvastatin 40 MG tablet Commonly known as:  CRESTOR Take 1 tablet (40 mg total) by mouth daily.

## 2018-02-24 NOTE — Telephone Encounter (Signed)
Please do this for her

## 2018-02-28 ENCOUNTER — Other Ambulatory Visit: Payer: Self-pay | Admitting: Family Medicine

## 2018-02-28 ENCOUNTER — Encounter: Payer: Self-pay | Admitting: Family Medicine

## 2018-02-28 LAB — PAP LB (LIQUID-BASED): PAP Smear Comment: 0

## 2018-02-28 MED ORDER — NONFORMULARY OR COMPOUNDED ITEM: 0 refills | Status: DC

## 2018-03-01 LAB — COLOGUARD: Cologuard: NEGATIVE

## 2018-03-02 ENCOUNTER — Other Ambulatory Visit: Payer: Self-pay | Admitting: Family Medicine

## 2018-03-03 LAB — LIPID PANEL W/O CHOL/HDL RATIO
Cholesterol, Total: 151 mg/dL (ref 100–199)
HDL: 51 mg/dL (ref >39)
LDL Calculated: 53 mg/dL (ref 0–99)
Triglycerides: 236 mg/dL — ABNORMAL HIGH (ref 0–149)
VLDL CHOLESTEROL CAL: 47 mg/dL — AB (ref 5–40)

## 2018-03-03 LAB — SPECIMEN STATUS REPORT

## 2018-03-04 ENCOUNTER — Encounter: Payer: Self-pay | Admitting: Family Medicine

## 2018-03-08 ENCOUNTER — Other Ambulatory Visit: Payer: Self-pay | Admitting: Family Medicine

## 2018-03-08 MED ORDER — ROSUVASTATIN CALCIUM 40 MG PO TABS: 40.0000 mg | ORAL_TABLET | Freq: Every day | ORAL | 3 refills | Status: DC

## 2018-03-14 ENCOUNTER — Other Ambulatory Visit: Payer: Self-pay | Admitting: Family Medicine

## 2018-03-14 DIAGNOSIS — Z1231 Encounter for screening mammogram for malignant neoplasm of breast: Secondary | ICD-10-CM

## 2018-03-15 ENCOUNTER — Encounter: Payer: Self-pay | Admitting: Family Medicine

## 2018-04-25 ENCOUNTER — Ambulatory Visit
Admission: RE | Admit: 2018-04-25 | Discharge: 2018-04-25 | Disposition: A | Discharge status: Home / Self Care | Payer: Managed Care, Other (non HMO) | Source: Ambulatory Visit | Attending: Family Medicine | Admitting: Family Medicine

## 2018-04-25 DIAGNOSIS — Z1231 Encounter for screening mammogram for malignant neoplasm of breast: Secondary | ICD-10-CM | POA: Diagnosis not present

## 2018-05-16 ENCOUNTER — Encounter: Payer: Self-pay | Admitting: Family Medicine

## 2018-05-16 ENCOUNTER — Other Ambulatory Visit: Payer: Self-pay | Admitting: Family Medicine

## 2018-05-17 MED ORDER — METFORMIN HCL ER 500 MG PO TB24: 1000.0000 mg | ORAL_TABLET | Freq: Every day | ORAL | 3 refills | Status: DC

## 2018-05-17 NOTE — Addendum Note (Signed)
Addended by: Damita Lack on: 05/17/2018 11:10 AM   Modules accepted: Orders

## 2018-05-18 MED ORDER — METFORMIN HCL ER 500 MG PO TB24: 1000.0000 mg | ORAL_TABLET | Freq: Every day | ORAL | 3 refills | Status: DC

## 2018-05-18 NOTE — Addendum Note (Signed)
Addended by: Damita Lack on: 05/18/2018 02:15 PM   Modules accepted: Orders

## 2018-05-18 NOTE — Telephone Encounter (Signed)
Received fax from [facility] stating that they can not process refill request electronically at this time.  They ask that we fax them a sign prescription for Metformin.  Rx printed and faxed to [facility] at [phone].

## 2018-06-21 LAB — HM DIABETES EYE EXAM

## 2018-06-23 ENCOUNTER — Encounter: Payer: Self-pay | Admitting: Family Medicine

## 2018-07-14 ENCOUNTER — Other Ambulatory Visit: Payer: Self-pay | Admitting: *Deleted

## 2018-07-14 MED ORDER — LISINOPRIL 40 MG PO TABS: 40.0000 mg | ORAL_TABLET | Freq: Every day | ORAL | 1 refills | Status: DC

## 2018-09-12 ENCOUNTER — Encounter: Payer: Self-pay | Admitting: Urology

## 2018-09-12 ENCOUNTER — Ambulatory Visit (INDEPENDENT_AMBULATORY_CARE_PROVIDER_SITE_OTHER): Payer: Managed Care, Other (non HMO) | Admitting: Urology

## 2018-09-12 ENCOUNTER — Other Ambulatory Visit
Admission: RE | Admit: 2018-09-12 | Discharge: 2018-09-12 | Disposition: A | Discharge status: Home / Self Care | Payer: Managed Care, Other (non HMO) | Attending: Urology | Admitting: Urology

## 2018-09-12 VITALS — BP 156/94 | HR 100 | Ht 68.0 in | Wt 269.0 lb

## 2018-09-12 DIAGNOSIS — M545 Low back pain, unspecified: Secondary | ICD-10-CM

## 2018-09-12 DIAGNOSIS — N939 Abnormal uterine and vaginal bleeding, unspecified: Secondary | ICD-10-CM | POA: Diagnosis not present

## 2018-09-12 DIAGNOSIS — R310 Gross hematuria: Secondary | ICD-10-CM | POA: Diagnosis not present

## 2018-09-12 LAB — URINALYSIS, COMPLETE (UACMP) WITH MICROSCOPIC
BILIRUBIN URINE: NEGATIVE
GLUCOSE, UA: NEGATIVE mg/dL
KETONES UR: NEGATIVE mg/dL
LEUKOCYTES UA: NEGATIVE
Nitrite: NEGATIVE
PROTEIN: NEGATIVE mg/dL
Specific Gravity, Urine: 1.02 (ref 1.005–1.030)
pH: 5.5 (ref 5.0–8.0)

## 2018-09-12 LAB — CREATININE, SERUM
Creatinine, Ser: 0.8 mg/dL (ref 0.44–1.00)
GFR calc Af Amer: >60 mL/min (ref >60)
GFR calc non Af Amer: >60 mL/min (ref >60)

## 2018-09-12 LAB — BUN: BUN: 22 mg/dL — AB (ref 6–20)

## 2018-09-12 NOTE — Progress Notes (Signed)
10:17 AM   Joyce Rowe 1965/12/09 [id number]  Referring provider: Hannah Beat, MD [address] [place], [place] [address]  Chief Complaint  Patient presents with  . Hematuria    HPI: Patient is a 53 year old Caucasian female who presents today requesting an urgent appointment for   She stated that she saw a little bit of blood in the 15th and 16th which she passed off as vaginal.  She became concerned when the blood persisted over the last week when she wipes.    She than started to feel a "knot" in her left flank last week.  She describes the pain as a soreness.  3/10 pain.  She is using a heating pad as the pain makes her stiff.    She does not have a family medical history of nephrolithiasis, malignancies of the genitourinary tract or hematuria.   She is not having symptoms of frequent urination, urgency, dysuria, nocturia, incontinence, hesitancy, intermittency, straining to urinate or a weak urinary stream.  Her UA today demonstrates 0-5 WBC's and 0-5 RBC's.    Patient denies any dysuria or suprapubic/flank pain.  Patient denies any fevers, chills, nausea or vomiting.    CT Urogram completed on 11/25/2015 noted a large nonobstructing stone in the left renal pelvis with clustered tiny stones in the left lower pole collecting system.   Underwent ESWL in 12/2015.    KUB taken on 12/25/2015 noted that the previous identified left renal pelvic stone was no longer identified. The small left upper renal pole calyceal stones could not be excluded.    Renal ultrasound completed on 12/31/2015 noted no hydronephrosis or kidney stones.   KUB taken in 12/2016 noted no stones.    Her stone analysis noted a composition of 95% uric acid and 5% ammonia urate stone.  She not a smoker.   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  She has HTN. She has a high BMI.     PMH: Past Medical History:  Diagnosis Date  . Diabetes mellitus type 2,  uncontrolled, without complications ([facility]) 04/03/2015  . Hyperlipidemia   . Kidney stone   . Microscopic hematuria   . Morbid obesity with BMI of 40.0-44.9, adult ([facility]) 03/28/2014  . Unspecified essential hypertension     Surgical History: Past Surgical History:  Procedure Laterality Date  . CESAREAN SECTION  08/2002  . EXTRACORPOREAL SHOCK WAVE LITHOTRIPSY Left 12/12/2015   Procedure: EXTRACORPOREAL SHOCK WAVE LITHOTRIPSY (ESWL);  Surgeon: Vanna Scotland, MD;  Location: [facility];  Service: Urology;  Laterality: Left;  . LAPAROSCOPIC BILATERAL SALPINGECTOMY Bilateral 02/27/2016   Procedure: LAPAROSCOPIC BILATERAL SALPINGECTOMY;  Surgeon: Conard Novak, MD;  Location: [facility];  Service: Gynecology;  Laterality: Bilateral;  . LAPAROSCOPIC OVARIAN CYSTECTOMY Right 02/27/2016   Procedure: LAPAROSCOPIC OVARIAN CYSTECTOMY;  Surgeon: Conard Novak, MD;  Location: [facility];  Service: Gynecology;  Laterality: Right;  . tubes in ears     70's    Home Medications:  Allergies as of 09/12/2018      Reactions   Naproxen    REACTION: Rash   Bextra [valdecoxib] Rash   Metaxalone Rash   REACTION: Rash      Medication List       Accurate as of September 12, 2018 10:17 AM. Always use your most recent med list.        hydrochlorothiazide 25 MG tablet Commonly known as:  HYDRODIURIL TAKE 1 TABLET BY MOUTH  DAILY  lisinopril 40 MG tablet Commonly known as:  PRINIVIL,ZESTRIL Take 1 tablet (40 mg total) by mouth daily.   metFORMIN 500 MG 24 hr tablet Commonly known as:  GLUCOPHAGE-XR Take 2 tablets (1,000 mg total) by mouth daily with breakfast.   NONFORMULARY OR COMPOUNDED ITEM Epic Account: [id number] Lab Studies: FLP: E78.5   ONE TOUCH ULTRA TEST test strip Generic drug:  glucose blood Use to check blood sugar  two times a day   rosuvastatin 40 MG tablet Commonly known as:  CRESTOR TAKE 1 TABLET BY MOUTH  DAILY       Allergies:  Allergies  Allergen Reactions  . Naproxen       REACTION: Rash  . Bextra [Valdecoxib] Rash  . Metaxalone Rash    REACTION: Rash    Family History: Family History  Problem Relation Age of Onset  . Hypertension Mother   . Hypertension Father   . Hematuria Father   . Colon cancer Unknown        Grandparents  <60  . Heart disease Unknown        Grandparents  . Diabetes Maternal Grandmother   . Breast cancer Neg Hx   . Kidney disease Neg Hx   . Bladder Cancer Neg Hx   . Kidney cancer Neg Hx     Social History:  reports that she has never smoked. She has never used smokeless tobacco. She reports that she does not drink alcohol or use drugs.  ROS: UROLOGY Frequent Urination?: No Hard to postpone urination?: No Burning/pain with urination?: No Get up at night to urinate?: No Leakage of urine?: No Urine stream starts and stops?: No Trouble starting stream?: No Do you have to strain to urinate?: No Blood in urine?: Yes Urinary tract infection?: No Sexually transmitted disease?: No Injury to kidneys or bladder?: No Painful intercourse?: No Weak stream?: No Currently pregnant?: No Vaginal bleeding?: No Last menstrual period?: n  Gastrointestinal Nausea?: No Vomiting?: No Indigestion/heartburn?: No Diarrhea?: No Constipation?: No  Constitutional Fever: No Night sweats?: No Weight loss?: No Fatigue?: No  Skin Skin rash/lesions?: No Itching?: No  Eyes Blurred vision?: No Double vision?: No  Ears/Nose/Throat Sore throat?: No Sinus problems?: No  Hematologic/Lymphatic Swollen glands?: No Easy bruising?: No  Cardiovascular Leg swelling?: No Chest pain?: No  Respiratory Cough?: No Shortness of breath?: No  Endocrine Excessive thirst?: No  Musculoskeletal Back pain?: No Joint pain?: No  Neurological Headaches?: No Dizziness?: No  Psychologic Depression?: No Anxiety?: No  Physical Exam: BP (!) 156/94   Pulse 100   Ht 5\' 8"  (1.727 m)   Wt 269 lb (122 kg)   BMI 40.90 kg/m    Constitutional:  Well nourished. Alert and oriented, No acute distress. HEENT: [place] AT, moist mucus membranes.  Trachea midline, no masses. Cardiovascular: No clubbing, cyanosis, or edema. Respiratory: Normal respiratory effort, no increased work of breathing. GI: Abdomen is obese, soft, non tender, non distended, no abdominal masses. Liver and spleen not palpable.  No hernias appreciated.  Stool sample for occult testing is not indicated.   GU: No CVA tenderness.  No bladder fullness or masses.  Normal external genitalia, normal pubic hair distribution, no lesions.  Normal urethral meatus, no lesions, no prolapse, no discharge.   No urethral masses, tenderness and/or tenderness. No bladder fullness, tenderness or masses. Normal vagina mucosa, good estrogen effect, bloody discharge, no lesions, poor pelvic support, grade II cystocele and no rectocele noted.  No cervical motion tenderness.  Uterus is freely mobile  and non-fixed.  Anus and perineum are without rashes or lesions.    Skin: No rashes, bruises or suspicious lesions. Lymph: No cervical or inguinal adenopathy. Neurologic: Grossly intact, no focal deficits, moving all 4 extremities. Psychiatric: Normal mood and affect.   Laboratory Data: Lab Results  Component Value Date   WBC 9.6 02/14/2018   HGB 15.6 02/14/2018   HCT 45.7 02/14/2018   MCV 83 02/14/2018   PLT 259 02/14/2018    Lab Results  Component Value Date   CREATININE 0.80 09/12/2018    Lab Results  Component Value Date   HGBA1C 7.8 (H) 02/14/2018    Lab Results  Component Value Date   TSH 2.170 12/18/2016       Component Value Date/Time   CHOL 151 03/02/2018 0910   HDL 51 03/02/2018 0910   CHOLHDL 3.5 12/18/2016 0925   LDLCALC 53 03/02/2018 0910    Lab Results  Component Value Date   AST 28 12/18/2016   Lab Results  Component Value Date   ALT 26 12/18/2016    Results for orders placed or performed during the hospital encounter of 09/12/18   Creatinine, serum  Result Value Ref Range   Creatinine, Ser 0.80 0.44 - 1.00 mg/dL   GFR calc non Af Amer >60 >60 mL/min   GFR calc Af Amer >60 >60 mL/min  BUN  Result Value Ref Range   BUN 22 (H) 6 - 20 mg/dL  Urinalysis, Complete w Microscopic (For [facility] ONLY)  Result Value Ref Range   Color, Urine YELLOW YELLOW   APPearance CLEAR CLEAR   Specific Gravity, Urine 1.020 1.005 - 1.030   pH 5.5 5.0 - 8.0   Glucose, UA NEGATIVE NEGATIVE mg/dL   Hgb urine dipstick MODERATE (A) NEGATIVE   Bilirubin Urine NEGATIVE NEGATIVE   Ketones, ur NEGATIVE NEGATIVE mg/dL   Protein, ur NEGATIVE NEGATIVE mg/dL   Nitrite NEGATIVE NEGATIVE   Leukocytes, UA NEGATIVE NEGATIVE   Squamous Epithelial / LPF 0-5 0 - 5   WBC, UA 0-5 0 - 5 WBC/hpf   RBC / HPF 0-5 0 - 5 RBC/hpf   Bacteria, UA RARE (A) NONE SEEN   Pertinent imaging N/A   Assessment & Plan:    1. Vaginal bleeding - advised the patient to follow up with her gynecologist as this is likely perimenopausal bleeding as she states she has not had a period in 6 months and to defer the follow up for this with them  2. Back pain - likely MSK as it causes a stiffness and is not colicky and no CVA tenderness is noted  - she should contact our office if she should develop urinary symptoms, gross hematuria or colicky pain associated with F/C/N/V  Return for patient advised to see gynecologist for vaginal bleeding .  These notes generated with voice recognition software. I apologize for typographical errors.  Michiel CowboySHANNON Sussan Meter, PA-C  [facility] [address] [place], [place] [address] [phone]

## 2018-09-13 ENCOUNTER — Encounter: Payer: Self-pay | Admitting: Obstetrics & Gynecology

## 2018-09-13 ENCOUNTER — Other Ambulatory Visit (HOSPITAL_COMMUNITY)
Admission: RE | Admit: 2018-09-13 | Discharge: 2018-09-13 | Disposition: A | Discharge status: Home / Self Care | Payer: Managed Care, Other (non HMO) | Source: Ambulatory Visit | Attending: Obstetrics & Gynecology | Admitting: Obstetrics & Gynecology

## 2018-09-13 ENCOUNTER — Ambulatory Visit (INDEPENDENT_AMBULATORY_CARE_PROVIDER_SITE_OTHER): Payer: Managed Care, Other (non HMO) | Admitting: Obstetrics & Gynecology

## 2018-09-13 VITALS — BP 140/80 | Ht 68.0 in | Wt 270.0 lb

## 2018-09-13 DIAGNOSIS — Z6841 Body Mass Index (BMI) 40.0 and over, adult: Secondary | ICD-10-CM | POA: Diagnosis not present

## 2018-09-13 DIAGNOSIS — N950 Postmenopausal bleeding: Secondary | ICD-10-CM | POA: Insufficient documentation

## 2018-09-13 LAB — URINE CULTURE

## 2018-09-13 NOTE — Progress Notes (Signed)
Consult Dr Patsy Lager Reason- PMB  Postmenopausal Bleeding Patient complains of vaginal bleeding. Her LMP was 12/2017 and she has menopausal sx's of hot flashes.  Recent bleeding caught her by surprise, she is alarmed.  One week of spotting like bleeding; also having breast T.  Periods prior to 12/2017 were somewhat regular.  Has one child, age 51 Obesity has been a concern for her for years.  Diabetes.  Menstrual History: OB History    Gravida  2   Para  1   Term  1   Preterm      AB  1   Living  1     SAB  1   TAB      Ectopic      Multiple      Live Births  1           PMHx: She  has a past medical history of Diabetes mellitus type 2, uncontrolled, without complications (HCC) (04/03/2015), Hyperlipidemia, Kidney stone, Microscopic hematuria, Morbid obesity with BMI of 40.0-44.9, adult (HCC) (03/28/2014), and Unspecified essential hypertension. Also,  has a past surgical history that includes Cesarean section (08/2002); tubes in ears; Extracorporeal shock wave lithotripsy (Left, 12/12/2015); Laparoscopic ovarian cystectomy (Right, 02/27/2016); and Laparoscopic bilateral salpingectomy (Bilateral, 02/27/2016)., family history includes Colon cancer in an other family member; Diabetes in her maternal grandmother; Heart disease in an other family member; Hematuria in her father; Hypertension in her father and mother.,  reports that she has never smoked. She has never used smokeless tobacco. She reports that she does not drink alcohol or use drugs.  She has a current medication list which includes the following prescription(s): hydrochlorothiazide, lisinopril, metformin, NONFORMULARY OR COMPOUNDED ITEM, one touch ultra test, and rosuvastatin. Also, is allergic to naproxen; bextra [valdecoxib]; and metaxalone.  Review of Systems  Constitutional: Negative for chills, fever and malaise/fatigue.  HENT: Negative for congestion, sinus pain and sore throat.   Eyes: Negative for blurred  vision and pain.  Respiratory: Negative for cough and wheezing.   Cardiovascular: Negative for chest pain and leg swelling.  Gastrointestinal: Negative for abdominal pain, constipation, diarrhea, heartburn, nausea and vomiting.  Genitourinary: Negative for dysuria, frequency, hematuria and urgency.  Musculoskeletal: Negative for back pain, joint pain, myalgias and neck pain.  Skin: Negative for itching and rash.  Neurological: Negative for dizziness, tremors and weakness.  Endo/Heme/Allergies: Does not bruise/bleed easily.  Psychiatric/Behavioral: Negative for depression. The patient is not nervous/anxious and does not have insomnia.     Objective: BP 140/80   Ht 5\' 8"  (1.727 m)   Wt 270 lb (122.5 kg)   LMP 08/24/2017   BMI 41.05 kg/m  Physical Exam Constitutional:      General: She is not in acute distress.    Appearance: She is well-developed.  Genitourinary:     Pelvic exam was performed with patient supine.     Vagina, uterus and rectum normal.     No lesions in the vagina.     No vaginal bleeding.     No cervical motion tenderness, friability, lesion or polyp.     Uterus is mobile.     Uterus is not enlarged.     No uterine mass detected.    Uterus is midaxial.     No right or left adnexal mass present.     Right adnexa not tender.     Left adnexa not tender.     Genitourinary Comments: Blood in vagina  HENT:     Head:  Normocephalic and atraumatic. No laceration.     Right Ear: Hearing normal.     Left Ear: Hearing normal.     Mouth/Throat:     Pharynx: Uvula midline.  Eyes:     Pupils: Pupils are equal, round, and reactive to light.  Neck:     Musculoskeletal: Normal range of motion and neck supple.     Thyroid: No thyromegaly.  Cardiovascular:     Rate and Rhythm: Normal rate and regular rhythm.     Heart sounds: No murmur. No friction rub. No gallop.   Pulmonary:     Effort: Pulmonary effort is normal. No respiratory distress.     Breath sounds: Normal  breath sounds. No wheezing.  Chest:     Breasts:        Right: No mass, skin change or tenderness.        Left: No mass, skin change or tenderness.  Abdominal:     General: Bowel sounds are normal. There is no distension.     Palpations: Abdomen is soft.     Tenderness: There is no abdominal tenderness. There is no rebound.  Musculoskeletal: Normal range of motion.  Neurological:     Mental Status: She is alert and oriented to person, place, and time.     Cranial Nerves: No cranial nerve deficit.  Skin:    General: Skin is warm and dry.  Psychiatric:        Judgment: Judgment normal.  Vitals signs reviewed.   ASSESSMENT/PLAN:   Problem List Items Addressed This Visit      Other   Morbid obesity with BMI of 40.0-44.9, adult (HCC)   Post-menopausal bleeding - Primary    EMB today based on risk factors of perimenopausal bleeding and obesity Cont to monitor for s/sx menopause No hormones indicated at this time   Endometrial Biopsy After discussion with the patient regarding her abnormal uterine bleeding I recommended that she proceed with an endometrial biopsy for further diagnosis. The risks, benefits, alternatives, and indications for an endometrial biopsy were discussed with the patient in detail. She understood the risks including infection, bleeding, cervical laceration and uterine perforation.  Verbal consent was obtained.   PROCEDURE NOTE:  Pipelle endometrial biopsy was performed using aseptic technique with iodine preparation.  The uterus was sounded to a length of 7 cm.  Adequate sampling was obtained with minimal blood loss.  The patient tolerated the procedure well.  Disposition will be pending pathology.  Annamarie Major, MD, [facility], [facility] 09/13/2018  10:23 AM

## 2018-09-13 NOTE — Patient Instructions (Signed)

## 2018-11-24 ENCOUNTER — Other Ambulatory Visit: Payer: Self-pay | Admitting: Family Medicine

## 2019-02-03 ENCOUNTER — Telehealth: Payer: Self-pay | Admitting: Family Medicine

## 2019-02-03 NOTE — Telephone Encounter (Signed)
Please schedule CPE with fasting labs prior with Dr. Lorelei Pont for mid to end of July.

## 2019-02-08 ENCOUNTER — Other Ambulatory Visit: Payer: Self-pay | Admitting: Family Medicine

## 2019-02-08 NOTE — Telephone Encounter (Signed)
Left message asking pt to call office  °

## 2019-02-16 NOTE — Telephone Encounter (Signed)
Spoke with pt she stated she will call back to schedule 

## 2019-03-15 ENCOUNTER — Other Ambulatory Visit: Payer: Self-pay | Admitting: Family Medicine

## 2019-03-15 ENCOUNTER — Encounter: Payer: Self-pay | Admitting: Family Medicine

## 2019-03-15 DIAGNOSIS — Z1239 Encounter for other screening for malignant neoplasm of breast: Secondary | ICD-10-CM

## 2019-03-22 MED ORDER — NONFORMULARY OR COMPOUNDED ITEM: 0 refills | Status: DC

## 2019-03-22 NOTE — Telephone Encounter (Signed)
ready

## 2019-03-24 NOTE — Telephone Encounter (Signed)
Rosaria Ferries,   This does not make sense.  I thought  That mammograms could be scheduled by the patient by law?

## 2019-03-30 MED ORDER — NONFORMULARY OR COMPOUNDED ITEM: 0 refills | Status: DC

## 2019-03-30 NOTE — Addendum Note (Signed)
Addended by: Owens Loffler on: 03/30/2019 11:01 AM   Modules accepted: Orders

## 2019-05-05 ENCOUNTER — Ambulatory Visit
Admission: RE | Admit: 2019-05-05 | Discharge: 2019-05-05 | Disposition: A | Discharge status: Home / Self Care | Payer: Managed Care, Other (non HMO) | Source: Ambulatory Visit | Attending: Family Medicine | Admitting: Family Medicine

## 2019-05-05 DIAGNOSIS — Z1239 Encounter for other screening for malignant neoplasm of breast: Secondary | ICD-10-CM

## 2019-05-05 DIAGNOSIS — Z1231 Encounter for screening mammogram for malignant neoplasm of breast: Secondary | ICD-10-CM | POA: Insufficient documentation

## 2019-05-08 ENCOUNTER — Other Ambulatory Visit: Payer: Self-pay | Admitting: Family Medicine

## 2019-05-09 LAB — CBC WITH DIFFERENTIAL/PLATELET
Basophils Absolute: 0.1 10*3/uL (ref 0.0–0.2)
Basos: 1 %
EOS (ABSOLUTE): 0.3 10*3/uL (ref 0.0–0.4)
Eos: 4 %
Hematocrit: 46 % (ref 34.0–46.6)
Hemoglobin: 14.8 g/dL (ref 11.1–15.9)
Immature Grans (Abs): 0 10*3/uL (ref 0.0–0.1)
Immature Granulocytes: 0 %
Lymphocytes Absolute: 3.6 10*3/uL — ABNORMAL HIGH (ref 0.7–3.1)
Lymphs: 48 %
MCH: 28.2 pg (ref 26.6–33.0)
MCHC: 32.2 g/dL (ref 31.5–35.7)
MCV: 88 fL (ref 79–97)
Monocytes Absolute: 0.7 10*3/uL (ref 0.1–0.9)
Monocytes: 9 %
Neutrophils Absolute: 2.8 10*3/uL (ref 1.4–7.0)
Neutrophils: 38 %
Platelets: 274 10*3/uL (ref 150–450)
RBC: 5.24 x10E6/uL (ref 3.77–5.28)
RDW: 13.3 % (ref 11.7–15.4)
WBC: 7.5 10*3/uL (ref 3.4–10.8)

## 2019-05-09 LAB — BASIC METABOLIC PANEL
BUN/Creatinine Ratio: 22 (ref 9–23)
BUN: 20 mg/dL (ref 6–24)
CO2: 26 mmol/L (ref 20–29)
Calcium: 10.3 mg/dL — ABNORMAL HIGH (ref 8.7–10.2)
Chloride: 98 mmol/L (ref 96–106)
Creatinine, Ser: 0.92 mg/dL (ref 0.57–1.00)
GFR calc Af Amer: 83 mL/min/{1.73_m2} (ref >59)
GFR calc non Af Amer: 72 mL/min/{1.73_m2} (ref >59)
Glucose: 200 mg/dL — ABNORMAL HIGH (ref 65–99)
Potassium: 4.7 mmol/L (ref 3.5–5.2)
Sodium: 141 mmol/L (ref 134–144)

## 2019-05-09 LAB — HEPATIC FUNCTION PANEL
ALT: 33 IU/L — ABNORMAL HIGH (ref 0–32)
AST: 31 IU/L (ref 0–40)
Albumin: 4.4 g/dL (ref 3.8–4.9)
Alkaline Phosphatase: 79 IU/L (ref 39–117)
Bilirubin Total: 0.5 mg/dL (ref 0.0–1.2)
Bilirubin, Direct: 0.13 mg/dL (ref 0.00–0.40)
Total Protein: 6.5 g/dL (ref 6.0–8.5)

## 2019-05-09 LAB — HGB A1C W/O EAG: Hgb A1c MFr Bld: 8 % — ABNORMAL HIGH (ref 4.8–5.6)

## 2019-05-09 LAB — MICROALBUMIN / CREATININE URINE RATIO
Creatinine, Urine: 186.7 mg/dL
Microalb/Creat Ratio: 16 mg/g creat (ref 0–29)
Microalbumin, Urine: 29.1 ug/mL

## 2019-05-09 LAB — VITAMIN D 25 HYDROXY (VIT D DEFICIENCY, FRACTURES): Vit D, 25-Hydroxy: 31 ng/mL (ref 30.0–100.0)

## 2019-05-09 LAB — SPECIMEN STATUS REPORT

## 2019-05-09 LAB — LIPID PANEL W/O CHOL/HDL RATIO
Cholesterol, Total: 120 mg/dL (ref 100–199)
HDL: 39 mg/dL — ABNORMAL LOW (ref >39)
LDL Chol Calc (NIH): 45 mg/dL (ref 0–99)
Triglycerides: 225 mg/dL — ABNORMAL HIGH (ref 0–149)
VLDL Cholesterol Cal: 36 mg/dL (ref 5–40)

## 2019-05-09 LAB — TSH: TSH: 3.5 u[IU]/mL (ref 0.450–4.500)

## 2019-05-15 ENCOUNTER — Ambulatory Visit (INDEPENDENT_AMBULATORY_CARE_PROVIDER_SITE_OTHER): Payer: Managed Care, Other (non HMO) | Admitting: Family Medicine

## 2019-05-15 ENCOUNTER — Other Ambulatory Visit: Payer: Self-pay

## 2019-05-15 ENCOUNTER — Encounter: Payer: Self-pay | Admitting: Family Medicine

## 2019-05-15 VITALS — BP 178/88 | HR 116 | Temp 98.3°F | Ht 66.5 in | Wt 262.5 lb

## 2019-05-15 DIAGNOSIS — I10 Essential (primary) hypertension: Secondary | ICD-10-CM | POA: Diagnosis not present

## 2019-05-15 DIAGNOSIS — E7800 Pure hypercholesterolemia, unspecified: Secondary | ICD-10-CM | POA: Diagnosis not present

## 2019-05-15 DIAGNOSIS — Z0001 Encounter for general adult medical examination with abnormal findings: Secondary | ICD-10-CM | POA: Diagnosis not present

## 2019-05-15 DIAGNOSIS — E119 Type 2 diabetes mellitus without complications: Secondary | ICD-10-CM

## 2019-05-15 DIAGNOSIS — L729 Follicular cyst of the skin and subcutaneous tissue, unspecified: Secondary | ICD-10-CM

## 2019-05-15 DIAGNOSIS — Z23 Encounter for immunization: Secondary | ICD-10-CM

## 2019-05-15 DIAGNOSIS — Z0000 Encounter for general adult medical examination without abnormal findings: Secondary | ICD-10-CM

## 2019-05-15 MED ORDER — METFORMIN HCL ER 500 MG PO TB24: 1000.0000 mg | ORAL_TABLET | Freq: Every day | ORAL | 3 refills | Status: DC

## 2019-05-15 MED ORDER — HYDROCHLOROTHIAZIDE 25 MG PO TABS: 25.0000 mg | ORAL_TABLET | Freq: Every day | ORAL | 3 refills | Status: DC

## 2019-05-15 MED ORDER — ONETOUCH ULTRA VI STRP: ORAL_STRIP | 3 refills | Status: DC

## 2019-05-15 MED ORDER — LISINOPRIL 40 MG PO TABS: 40.0000 mg | ORAL_TABLET | Freq: Every day | ORAL | 3 refills | Status: DC

## 2019-05-15 MED ORDER — ROSUVASTATIN CALCIUM 40 MG PO TABS: 40.0000 mg | ORAL_TABLET | Freq: Every day | ORAL | 3 refills | Status: DC

## 2019-05-15 MED ORDER — METFORMIN HCL ER 500 MG PO TB24: 1500.0000 mg | ORAL_TABLET | Freq: Every day | ORAL | 0 refills | Status: DC

## 2019-05-15 MED ORDER — LISINOPRIL 40 MG PO TABS: 40.0000 mg | ORAL_TABLET | Freq: Every day | ORAL | 0 refills | Status: DC

## 2019-05-15 MED ORDER — ONETOUCH ULTRA VI STRP: ORAL_STRIP | 0 refills | Status: DC

## 2019-05-15 MED ORDER — METFORMIN HCL ER 500 MG PO TB24: 1500.0000 mg | ORAL_TABLET | Freq: Every day | ORAL | 1 refills | Status: DC

## 2019-05-15 MED ORDER — HYDROCHLOROTHIAZIDE 25 MG PO TABS: 25.0000 mg | ORAL_TABLET | Freq: Every day | ORAL | 0 refills | Status: DC

## 2019-05-15 NOTE — Progress Notes (Signed)
Sameka Bagent T. Delainie Chavana, MD Primary Care and Sports Medicine [facility] at [facility] [address] [place], [address] Phone: [phone]  FAX: [phone]  Joyce Rowe - 53 y.o. female  MRN [id number]  Date of Birth: [birth year]  Visit Date: 05/15/2019  PCP: Hannah Beat, MD  Referred by: Hannah Beat, MD  Chief Complaint  Patient presents with  . Annual Exam   Patient Care Team: Hannah Beat, MD as PCP - General Subjective:   Joyce Rowe is a 53 y.o. pleasant patient who presents with the following:  Health Maintenance Summary Reviewed and updated, unless pt declines services.  Tobacco History Reviewed. Non-smoker Alcohol: No concerns, no excessive use Exercise Habits: Some activity, rec at least 30 mins 5 times a week STD concerns: none Drug Use: None Birth control method: GYN Menses regular: GYN Lumps or breast concerns: GYN Breast Cancer Family History: no  BP: 178/88  Daughter at [facility].  Bumps on back of head.  2 lumps on back of head. 3-4 months.  120's at home. 170 in the office.  Diabetes Mellitus: Tolerating Medications: yes Compliance with diet: fair, Body mass index is 41.73 kg/m. Exercise: minimal / intermittent Avg blood sugars at home: not checking Foot problems: none Hypoglycemia: none No nausea, vomitting, blurred vision, polyuria.  Lab Results  Component Value Date   HGBA1C 8.0 (H) 05/08/2019   HGBA1C 7.8 (H) 02/14/2018   HGBA1C 6.7 (H) 12/18/2016   Lab Results  Component Value Date   LDLCALC 45 05/08/2019   CREATININE 0.92 05/08/2019    Wt Readings from Last 3 Encounters:  05/15/19 262 lb 8 oz (119.1 kg)  09/13/18 270 lb (122.5 kg)  09/12/18 269 lb (122 kg)     Health Maintenance  Topic Date Due  . HIV Screening  01/05/1983  . FOOT EXAM  02/09/2017  . OPHTHALMOLOGY EXAM  06/22/2019  . HEMOGLOBIN A1C  11/05/2019  . MAMMOGRAM  05/04/2020  . Fecal DNA (Cologuard)  03/01/2021  . PAP  SMEAR-Modifier  02/24/2023  . TETANUS/TDAP  03/28/2024  . INFLUENZA VACCINE  Completed  . PNEUMOCOCCAL POLYSACCHARIDE VACCINE AGE 40-64 HIGH RISK  Completed    Immunization History  Administered Date(s) Administered  . Influenza,inj,Quad PF,6+ Mos 05/13/2015, 05/15/2019  . Pneumococcal Polysaccharide-23 05/13/2015  . Tdap 03/28/2014   Patient Active Problem List   Diagnosis Date Noted  . Post-menopausal bleeding 09/13/2018  . Kidney stones 12/28/2015  . Cysts of both ovaries 12/28/2015  . Microscopic hematuria 11/12/2015  . Essential hypertension 08/21/2015  . Diabetes mellitus type 2, uncontrolled, without complications 04/03/2015  . Hyperlipidemia LDL goal <70 03/28/2014  . Morbid obesity with BMI of 40.0-44.9, adult (HCC) 03/28/2014   Past Medical History:  Diagnosis Date  . Diabetes mellitus type 2, uncontrolled, without complications 04/03/2015  . Hyperlipidemia   . Kidney stone   . Microscopic hematuria   . Morbid obesity with BMI of 40.0-44.9, adult (HCC) 03/28/2014  . Unspecified essential hypertension    Past Surgical History:  Procedure Laterality Date  . CESAREAN SECTION  08/2002  . EXTRACORPOREAL SHOCK WAVE LITHOTRIPSY Left 12/12/2015   Procedure: EXTRACORPOREAL SHOCK WAVE LITHOTRIPSY (ESWL);  Surgeon: Vanna Scotland, MD;  Location: [facility] ORS;  Service: Urology;  Laterality: Left;  . LAPAROSCOPIC BILATERAL SALPINGECTOMY Bilateral 02/27/2016   Procedure: LAPAROSCOPIC BILATERAL SALPINGECTOMY;  Surgeon: Conard Novak, MD;  Location: [facility] ORS;  Service: Gynecology;  Laterality: Bilateral;  . LAPAROSCOPIC OVARIAN CYSTECTOMY Right 02/27/2016   Procedure: LAPAROSCOPIC  OVARIAN CYSTECTOMY;  Surgeon: Conard NovakStephen D Jackson, MD;  Location: [facility] ORS;  Service: Gynecology;  Laterality: Right;  . tubes in ears     70's   Social History   Socioeconomic History  . Marital status: Married    Spouse name: Not on file  . Number of children: 1  . Years of education: Not on file  .  Highest education level: Not on file  Occupational History  . 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 Financial resource strain: Not on file  . Food insecurity    Worry: Not on file    Inability: Not on file  . Transportation needs    Medical: Not on file    Non-medical: Not on file  Tobacco Use  . Smoking status: Never Smoker  . Smokeless tobacco: Never Used  Substance and Sexual Activity  . Alcohol use: No  . Drug use: No  . Sexual activity: Yes  Lifestyle  . Physical activity    Days per week: Not on file    Minutes per session: Not on file  . Stress: Not on file  Relationships  . Social Musicianconnections    Talks on phone: Not on file    Gets together: Not on file    Attends religious service: Not on file    Active member of club or organization: Not on file    Attends meetings of clubs or organizations: Not on file    Relationship status: Not on file  . Intimate partner violence    Fear of current or ex partner: Not on file    Emotionally abused: Not on file    Physically abused: Not on file    Forced sexual activity: Not on file  Other Topics Concern  . Not on file  Social History Narrative   Married      1 child      BS, SAHM      6-8 hours sleep per night      3 people living in home      Regular exercise-yes   Family History  Problem Relation Age of Onset  . Hypertension Mother   . Breast cancer Mother 675  . Hypertension Father   . Hematuria Father   . Colon cancer Other        Grandparents  <60  . Heart disease Other        Grandparents  . Diabetes Maternal Grandmother   . Kidney disease Neg Hx   . Bladder Cancer Neg Hx   . Kidney cancer Neg Hx    Allergies  Allergen Reactions  . Naproxen     REACTION: Rash  . Bextra [Valdecoxib] Rash  . Metaxalone Rash    REACTION: Rash    Medication list has been reviewed and updated.   General: Denies fever, chills, sweats. No significant weight loss. Eyes: Denies blurring,significant itching ENT:  Denies earache, sore throat, and hoarseness.  Cardiovascular: Denies chest pains, palpitations, dyspnea on exertion,  Respiratory: Denies cough, dyspnea at rest,wheeezing Breast: no concerns about lumps GI: Denies nausea, vomiting, diarrhea, constipation, change in bowel habits, abdominal pain, melena, hematochezia GU: Denies dysuria, hematuria, urinary hesitancy, nocturia, denies STD risk, no concerns about discharge Musculoskeletal: Denies back pain, joint pain Derm: 2 mobile cysts on back of head Neuro: Denies  paresthesias, frequent falls, frequent headaches Psych: Denies depression, anxiety Endocrine: Denies cold intolerance, heat intolerance, polydipsia Heme: Denies enlarged lymph nodes Allergy: No hayfever  Objective:   BP (!) 178/88  Pulse (!) 116   Temp 98.3 F (36.8 C) (Temporal)   Ht 5' 6.5" (1.689 m)   Wt 262 lb 8 oz (119.1 kg)   LMP 03/30/2019   SpO2 96%   BMI 41.73 kg/m  Ideal Body Weight: Weight in (lb) to have BMI = 25: 156.9 No exam data present Depression screen [facility] 2/9 05/15/2019 02/23/2018 02/08/2017 04/12/2015  Decreased Interest 0 0 0 0  Down, Depressed, Hopeless 0 0 0 0  PHQ - 2 Score 0 0 0 0     GEN: well developed, well nourished, no acute distress Eyes: conjunctiva and lids normal, PERRLA, EOMI ENT: TM clear, nares clear, oral exam WNL Neck: supple, no lymphadenopathy, no thyromegaly, no JVD Pulm: clear to auscultation and percussion, respiratory effort normal CV: regular rate and rhythm, S1-S2, no murmur, rub or gallop, no bruits Chest: no scars, masses, no lumps BREAST: breast exam declined GI: soft, non-tender; no hepatosplenomegaly, masses; active bowel sounds all quadrants GU: GU exam declined Lymph: no cervical, axillary or inguinal adenopathy MSK: gait normal, muscle tone and strength WNL, no joint swelling, effusions, discoloration, crepitus  SKIN: on back of occiput, there are 2 freely mobile cysts. Neuro: normal mental status, normal  strength, sensation, and motion Psych: alert; oriented to person, place and time, normally interactive and not anxious or depressed in appearance.   All labs reviewed with patient. Results for orders placed or performed in visit on 05/08/19  CBC with Differential/Platelet  Result Value Ref Range   WBC 7.5 3.4 - 10.8 x10E3/uL   RBC 5.24 3.77 - 5.28 x10E6/uL   Hemoglobin 14.8 11.1 - 15.9 g/dL   Hematocrit 66.2 94.7 - 46.6 %   MCV 88 79 - 97 fL   MCH 28.2 26.6 - 33.0 pg   MCHC 32.2 31.5 - 35.7 g/dL   RDW 65.4 65.0 - 35.4 %   Platelets 274 150 - 450 x10E3/uL   Neutrophils 38 Not Estab. %   Lymphs 48 Not Estab. %   Monocytes 9 Not Estab. %   Eos 4 Not Estab. %   Basos 1 Not Estab. %   Neutrophils Absolute 2.8 1.4 - 7.0 x10E3/uL   Lymphocytes Absolute 3.6 (H) 0.7 - 3.1 x10E3/uL   Monocytes Absolute 0.7 0.1 - 0.9 x10E3/uL   EOS (ABSOLUTE) 0.3 0.0 - 0.4 x10E3/uL   Basophils Absolute 0.1 0.0 - 0.2 x10E3/uL   Immature Granulocytes 0 Not Estab. %   Immature Grans (Abs) 0.0 0.0 - 0.1 x10E3/uL  Basic metabolic panel  Result Value Ref Range   Glucose 200 (H) 65 - 99 mg/dL   BUN 20 6 - 24 mg/dL   Creatinine, Ser 6.56 0.57 - 1.00 mg/dL   GFR calc non Af Amer 72 >59 mL/min/1.73   GFR calc Af Amer 83 >59 mL/min/1.73   BUN/Creatinine Ratio 22 9 - 23   Sodium 141 134 - 144 mmol/L   Potassium 4.7 3.5 - 5.2 mmol/L   Chloride 98 96 - 106 mmol/L   CO2 26 20 - 29 mmol/L   Calcium 10.3 (H) 8.7 - 10.2 mg/dL  Lipid Panel w/o Chol/HDL Ratio  Result Value Ref Range   Cholesterol, Total 120 100 - 199 mg/dL   Triglycerides 812 (H) 0 - 149 mg/dL   HDL 39 (L) >75 mg/dL   VLDL Cholesterol Cal 36 5 - 40 mg/dL   LDL Chol Calc (NIH) 45 0 - 99 mg/dL  Hepatic function panel  Result Value Ref Range   Total  Protein 6.5 6.0 - 8.5 g/dL   Albumin 4.4 3.8 - 4.9 g/dL   Bilirubin Total 0.5 0.0 - 1.2 mg/dL   Bilirubin, Direct 9.14 0.00 - 0.40 mg/dL   Alkaline Phosphatase 79 39 - 117 IU/L   AST 31 0 - 40 IU/L    ALT 33 (H) 0 - 32 IU/L  Microalbumin / creatinine urine ratio  Result Value Ref Range   Creatinine, Urine 186.7 Not Estab. mg/dL   Microalbumin, Urine 78.2 Not Estab. ug/mL   Microalb/Creat Ratio 16 0 - 29 mg/g creat  Hgb A1c w/o eAG  Result Value Ref Range   Hgb A1c MFr Bld 8.0 (H) 4.8 - 5.6 %  TSH  Result Value Ref Range   TSH 3.500 0.450 - 4.500 uIU/mL  VITAMIN D 25 Hydroxy (Vit-D Deficiency, Fractures)  Result Value Ref Range   Vit D, 25-Hydroxy 31.0 30.0 - 100.0 ng/mL  Specimen status report  Result Value Ref Range   specimen status report Comment    Mm 3d Screen Breast Bilateral  Result Date: 05/05/2019 CLINICAL DATA:  Screening. EXAM: DIGITAL SCREENING BILATERAL MAMMOGRAM WITH TOMO AND CAD COMPARISON:  Previous exam(s). ACR Breast Density Category b: There are scattered areas of fibroglandular density. FINDINGS: There are no findings suspicious for malignancy. Images were processed with CAD. IMPRESSION: No mammographic evidence of malignancy. A result letter of this screening mammogram will be mailed directly to the patient. RECOMMENDATION: Screening mammogram in one year. (Code:SM-B-01Y) BI-RADS CATEGORY  1: Negative. Electronically Signed   By: Elberta Fortis M.D.   On: 05/05/2019 13:25    Assessment and Plan:     ICD-10-CM   1. Healthcare maintenance  Z00.00   2. Diabetes mellitus without complication (HCC)  E11.9 glucose blood (ONETOUCH ULTRA) test strip    metFORMIN (GLUCOPHAGE-XR) 500 MG 24 hr tablet    DISCONTINUED: metFORMIN (GLUCOPHAGE-XR) 500 MG 24 hr tablet    DISCONTINUED: glucose blood (ONETOUCH ULTRA) test strip    DISCONTINUED: metFORMIN (GLUCOPHAGE-XR) 500 MG 24 hr tablet  3. Essential hypertension  I10 lisinopril (ZESTRIL) 40 MG tablet    hydrochlorothiazide (HYDRODIURIL) 25 MG tablet    DISCONTINUED: lisinopril (ZESTRIL) 40 MG tablet    DISCONTINUED: hydrochlorothiazide (HYDRODIURIL) 25 MG tablet  4. Hypercholesteremia  E78.00 rosuvastatin (CRESTOR) 40  MG tablet  5. Scalp cyst  L72.9 Ambulatory referral to Dermatology  6. Need for influenza vaccination  Z23 Flu Vaccine QUAD 6+ mos PF IM (Fluarix Quad PF)   DM not at goal increase metformin to 1500 mg a day  ? Cysts on back of head x 3-4 months.  Needs further eval - ask Derm opinion  HTN - check weekly.  Exceptionally anxious in office, even more than usual  Health Maintenance Exam: The patient's preventative maintenance and recommended screening tests for an annual wellness exam were reviewed in full today. Brought up to date unless services declined.  Counselled on the importance of diet, exercise, and its role in overall health and mortality. The patient's FH and SH was reviewed, including their home life, tobacco status, and drug and alcohol status.  Follow-up in 1 year for physical exam or additional follow-up below.  Follow-up: Return in about 3 months (around 08/15/2019). Or follow-up in 1 year if not noted.  Future Appointments  Date Time Provider Department Center  08/14/2019  8:00 AM Blossie Raffel, Karleen Hampshire, MD [facility]    Meds ordered this encounter  Medications  . DISCONTD: metFORMIN (GLUCOPHAGE-XR) 500 MG 24 hr tablet  Sig: Take 2 tablets (1,000 mg total) by mouth daily with breakfast.    Dispense:  180 tablet    Refill:  3  . rosuvastatin (CRESTOR) 40 MG tablet    Sig: Take 1 tablet (40 mg total) by mouth daily.    Dispense:  90 tablet    Refill:  3  . DISCONTD: lisinopril (ZESTRIL) 40 MG tablet    Sig: Take 1 tablet (40 mg total) by mouth daily.    Dispense:  90 tablet    Refill:  3  . DISCONTD: hydrochlorothiazide (HYDRODIURIL) 25 MG tablet    Sig: Take 1 tablet (25 mg total) by mouth daily.    Dispense:  90 tablet    Refill:  3  . DISCONTD: glucose blood (ONETOUCH ULTRA) test strip    Sig: Use to check blood sugar two times a day.    Dispense:  200 each    Refill:  3  . lisinopril (ZESTRIL) 40 MG tablet    Sig: Take 1 tablet (40 mg total) by mouth daily.     Dispense:  30 tablet    Refill:  0  . hydrochlorothiazide (HYDRODIURIL) 25 MG tablet    Sig: Take 1 tablet (25 mg total) by mouth daily.    Dispense:  30 tablet    Refill:  0  . glucose blood (ONETOUCH ULTRA) test strip    Sig: Use to check blood sugar two times a day.    Dispense:  50 each    Refill:  0  . DISCONTD: metFORMIN (GLUCOPHAGE-XR) 500 MG 24 hr tablet    Sig: Take 3 tablets (1,500 mg total) by mouth daily with breakfast.    Dispense:  90 tablet    Refill:  0  . metFORMIN (GLUCOPHAGE-XR) 500 MG 24 hr tablet    Sig: Take 3 tablets (1,500 mg total) by mouth daily with breakfast.    Dispense:  270 tablet    Refill:  1   Medications Discontinued During This Encounter  Medication Reason  . NONFORMULARY OR COMPOUNDED ITEM   . NONFORMULARY OR COMPOUNDED ITEM   . NONFORMULARY OR COMPOUNDED ITEM   . ONE TOUCH ULTRA TEST test strip Change in therapy  . rosuvastatin (CRESTOR) 40 MG tablet Reorder  . hydrochlorothiazide (HYDRODIURIL) 25 MG tablet Reorder  . metFORMIN (GLUCOPHAGE-XR) 500 MG 24 hr tablet Reorder  . lisinopril (ZESTRIL) 40 MG tablet Reorder  . lisinopril (ZESTRIL) 40 MG tablet Reorder  . hydrochlorothiazide (HYDRODIURIL) 25 MG tablet Reorder  . glucose blood (ONETOUCH ULTRA) test strip Reorder  . metFORMIN (GLUCOPHAGE-XR) 500 MG 24 hr tablet   . metFORMIN (GLUCOPHAGE-XR) 500 MG 24 hr tablet Reorder   Orders Placed This Encounter  Procedures  . Flu Vaccine QUAD 6+ mos PF IM (Fluarix Quad PF)  . Ambulatory referral to Dermatology    Signed,  Karleen Hampshire T. Wayde Gopaul, MD   Allergies as of 05/15/2019      Reactions   Naproxen    REACTION: Rash   Bextra [valdecoxib] Rash   Metaxalone Rash   REACTION: Rash      Medication List       Accurate as of May 15, 2019  3:29 PM. If you have any questions, ask your nurse or doctor.        STOP taking these medications   NONFORMULARY OR COMPOUNDED ITEM Stopped by: Hannah Beat, MD   NONFORMULARY OR  COMPOUNDED ITEM Stopped by: Hannah Beat, MD   NONFORMULARY OR COMPOUNDED ITEM Stopped by:  Owens Loffler, MD     TAKE these medications   hydrochlorothiazide 25 MG tablet Commonly known as: HYDRODIURIL Take 1 tablet (25 mg total) by mouth daily.   lisinopril 40 MG tablet Commonly known as: ZESTRIL Take 1 tablet (40 mg total) by mouth daily.   metFORMIN 500 MG 24 hr tablet Commonly known as: GLUCOPHAGE-XR Take 3 tablets (1,500 mg total) by mouth daily with breakfast. What changed: how much to take Changed by: Owens Loffler, MD   OneTouch Ultra test strip Generic drug: glucose blood Use to check blood sugar two times a day. What changed: additional instructions Changed by: Owens Loffler, MD   rosuvastatin 40 MG tablet Commonly known as: CRESTOR Take 1 tablet (40 mg total) by mouth daily.

## 2019-05-18 ENCOUNTER — Telehealth: Payer: Self-pay | Admitting: Family Medicine

## 2019-05-18 NOTE — Telephone Encounter (Signed)
Patient called asking about her referral to dermatology.  Patient prefers [place].  Patient can go anytime.

## 2019-07-12 ENCOUNTER — Encounter: Payer: Self-pay | Admitting: Family Medicine

## 2019-07-14 MED ORDER — NONFORMULARY OR COMPOUNDED ITEM: 0 refills | Status: DC

## 2019-07-14 NOTE — Telephone Encounter (Signed)
No problem - done

## 2019-07-14 NOTE — Telephone Encounter (Signed)
Orders mailed to home address.

## 2019-08-09 ENCOUNTER — Other Ambulatory Visit: Payer: Self-pay | Admitting: Family Medicine

## 2019-08-10 LAB — COMPREHENSIVE METABOLIC PANEL
ALT: 29 IU/L (ref 0–32)
AST: 34 IU/L (ref 0–40)
Albumin/Globulin Ratio: 2 (ref 1.2–2.2)
Albumin: 4.3 g/dL (ref 3.8–4.9)
Alkaline Phosphatase: 69 IU/L (ref 39–117)
BUN/Creatinine Ratio: 21 (ref 9–23)
BUN: 15 mg/dL (ref 6–24)
Bilirubin Total: 0.5 mg/dL (ref 0.0–1.2)
CO2: 24 mmol/L (ref 20–29)
Calcium: 10.1 mg/dL (ref 8.7–10.2)
Chloride: 95 mmol/L — ABNORMAL LOW (ref 96–106)
Creatinine, Ser: 0.72 mg/dL (ref 0.57–1.00)
GFR calc Af Amer: 112 mL/min/{1.73_m2} (ref >59)
GFR calc non Af Amer: 97 mL/min/{1.73_m2} (ref >59)
Globulin, Total: 2.2 g/dL (ref 1.5–4.5)
Glucose: 188 mg/dL — ABNORMAL HIGH (ref 65–99)
Potassium: 4.6 mmol/L (ref 3.5–5.2)
Sodium: 137 mmol/L (ref 134–144)
Total Protein: 6.5 g/dL (ref 6.0–8.5)

## 2019-08-10 LAB — MICROALBUMIN / CREATININE URINE RATIO
Creatinine, Urine: 233.2 mg/dL
Microalb/Creat Ratio: 10 mg/g creat (ref 0–29)
Microalbumin, Urine: 23.2 ug/mL

## 2019-08-10 LAB — CBC WITH DIFFERENTIAL/PLATELET
Basophils Absolute: 0.1 10*3/uL (ref 0.0–0.2)
Basos: 1 %
EOS (ABSOLUTE): 0.2 10*3/uL (ref 0.0–0.4)
Eos: 3 %
Hematocrit: 42.2 % (ref 34.0–46.6)
Hemoglobin: 14.3 g/dL (ref 11.1–15.9)
Immature Grans (Abs): 0 10*3/uL (ref 0.0–0.1)
Immature Granulocytes: 0 %
Lymphocytes Absolute: 3.1 10*3/uL (ref 0.7–3.1)
Lymphs: 39 %
MCH: 28.6 pg (ref 26.6–33.0)
MCHC: 33.9 g/dL (ref 31.5–35.7)
MCV: 84 fL (ref 79–97)
Monocytes Absolute: 0.7 10*3/uL (ref 0.1–0.9)
Monocytes: 9 %
Neutrophils Absolute: 3.7 10*3/uL (ref 1.4–7.0)
Neutrophils: 48 %
Platelets: 290 10*3/uL (ref 150–450)
RBC: 5 x10E6/uL (ref 3.77–5.28)
RDW: 13.4 % (ref 11.7–15.4)
WBC: 7.8 10*3/uL (ref 3.4–10.8)

## 2019-08-10 LAB — LIPID PANEL W/O CHOL/HDL RATIO
Cholesterol, Total: 110 mg/dL (ref 100–199)
HDL: 43 mg/dL (ref >39)
LDL Chol Calc (NIH): 34 mg/dL (ref 0–99)
Triglycerides: 209 mg/dL — ABNORMAL HIGH (ref 0–149)
VLDL Cholesterol Cal: 33 mg/dL (ref 5–40)

## 2019-08-10 LAB — HEPATIC FUNCTION PANEL: Bilirubin, Direct: 0.14 mg/dL (ref 0.00–0.40)

## 2019-08-10 LAB — HGB A1C W/O EAG: Hgb A1c MFr Bld: 7.2 % — ABNORMAL HIGH (ref 4.8–5.6)

## 2019-08-10 LAB — SPECIMEN STATUS REPORT

## 2019-08-10 LAB — VITAMIN D 25 HYDROXY (VIT D DEFICIENCY, FRACTURES): Vit D, 25-Hydroxy: 28.2 ng/mL — ABNORMAL LOW (ref 30.0–100.0)

## 2019-08-13 NOTE — Progress Notes (Signed)
Joyce Rowe T. Eiman Maret, MD Primary Care and [place] at [facility] [place], [address] Phone: [phone]  FAX: [phone]  Joyce Rowe - 54 y.o. female  MRN [id number]  Date of Birth: 1966/04/12  Visit Date: 08/14/2019  PCP: Owens Loffler, MD  Referred by: Owens Loffler, MD  Chief Complaint  Patient presents with  . Diabetes  . Hypertension    This visit occurred during the SARS-CoV-2 public health emergency.  Safety protocols were in place, including screening questions prior to the visit, additional usage of staff PPE, and extensive cleaning of exam room while observing appropriate contact time as indicated for disinfecting solutions.   Subjective:   Joyce Rowe is a 54 y.o. very pleasant female patient who presents with the following:  She is here today for follow-up primarily on diabetes.  She has been doing well and compliant.  Diabetes Mellitus: Tolerating Medications: yes Compliance with diet: fair, Body mass index is 41.34 kg/m. Exercise: minimal / intermittent Avg blood sugars at home: not checking Foot problems: none Hypoglycemia: none No nausea, vomitting, blurred vision, polyuria.  Doing well  Lab Results  Component Value Date   HGBA1C 7.2 (H) 08/09/2019   HGBA1C 8.0 (H) 05/08/2019   HGBA1C 7.8 (H) 02/14/2018   Lab Results  Component Value Date   LDLCALC 34 08/09/2019   CREATININE 0.72 08/09/2019    Wt Readings from Last 3 Encounters:  08/14/19 260 lb (117.9 kg)  05/15/19 262 lb 8 oz (119.1 kg)  09/13/18 270 lb (122.5 kg)    Lipids: Doing well, stable.  Panel reviewed with patient.  Lipids:    Component Value Date/Time   CHOL 110 08/09/2019 0825   TRIG 209 (H) 08/09/2019 0825   HDL 43 08/09/2019 0825   CHOLHDL 3.5 12/18/2016 0925    Lab Results  Component Value Date   ALT 29 08/09/2019   AST 34 08/09/2019   ALKPHOS 69 08/09/2019   BILITOT 0.5 08/09/2019      115/78, 120/80.  At home.   Health Maintenance  Topic Date Due  . HIV Screening  01/05/1983  . OPHTHALMOLOGY EXAM  06/22/2019  . HEMOGLOBIN A1C  02/07/2020  . MAMMOGRAM  05/04/2020  . FOOT EXAM  08/13/2020  . Fecal DNA (Cologuard)  03/01/2021  . PAP SMEAR-Modifier  02/24/2023  . TETANUS/TDAP  03/28/2024  . INFLUENZA VACCINE  Completed  . PNEUMOCOCCAL POLYSACCHARIDE VACCINE AGE 34-64 HIGH RISK  Completed     Past Medical History, Surgical History, Social History, Family History, Problem List, Medications, and Allergies have been reviewed and updated if relevant.  Patient Active Problem List   Diagnosis Date Noted  . Post-menopausal bleeding 09/13/2018  . Kidney stones 12/28/2015  . Cysts of both ovaries 12/28/2015  . Essential hypertension 08/21/2015  . Controlled type 2 diabetes mellitus without complication, without long-term current use of insulin ([place]) 04/03/2015  . Hyperlipidemia LDL goal <70 03/28/2014  . Morbid obesity with BMI of 40.0-44.9, adult ([address]) 03/28/2014    Past Medical History:  Diagnosis Date  . Diabetes mellitus type 2, uncontrolled, without complications 10/01/9796  . Hyperlipidemia   . Kidney stone   . Microscopic hematuria   . Morbid obesity with BMI of 40.0-44.9, adult ([place]) 03/28/2014  . Unspecified essential hypertension     Past Surgical History:  Procedure Laterality Date  . CESAREAN SECTION  08/2002  . EXTRACORPOREAL SHOCK WAVE LITHOTRIPSY Left 12/12/2015   Procedure: EXTRACORPOREAL SHOCK WAVE  LITHOTRIPSY (ESWL);  Surgeon: Vanna Scotland, MD;  Location: [facility] ORS;  Service: Urology;  Laterality: Left;  . LAPAROSCOPIC BILATERAL SALPINGECTOMY Bilateral 02/27/2016   Procedure: LAPAROSCOPIC BILATERAL SALPINGECTOMY;  Surgeon: Conard Novak, MD;  Location: [facility] ORS;  Service: Gynecology;  Laterality: Bilateral;  . LAPAROSCOPIC OVARIAN CYSTECTOMY Right 02/27/2016   Procedure: LAPAROSCOPIC OVARIAN CYSTECTOMY;  Surgeon: Conard Novak, MD;  Location:  [facility] ORS;  Service: Gynecology;  Laterality: Right;  . tubes in ears     70's    Social History   Socioeconomic History  . Marital status: Married    Spouse name: Not on file  . Number of children: 1  . Years of education: Not on file  . Highest education level: Not on file  Occupational History  . 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 Smoking status: Never Smoker  . Smokeless tobacco: Never Used  Substance and Sexual Activity  . Alcohol use: No  . Drug use: No  . Sexual activity: Yes  Other Topics Concern  . Not on file  Social History Narrative   Married      1 child      BS, SAHM      6-8 hours sleep per night      3 people living in home      Regular exercise-yes   Social Determinants of Health   Financial Resource Strain:   . Difficulty of Paying Living Expenses: Not on file  Food Insecurity:   . Worried About Programme researcher, broadcasting/film/video in the Last Year: Not on file  . Ran Out of Food in the Last Year: Not on file  Transportation Needs:   . Lack of Transportation (Medical): Not on file  . Lack of Transportation (Non-Medical): Not on file  Physical Activity:   . Days of Exercise per Week: Not on file  . Minutes of Exercise per Session: Not on file  Stress:   . Feeling of Stress : Not on file  Social Connections:   . Frequency of Communication with Friends and Family: Not on file  . Frequency of Social Gatherings with Friends and Family: Not on file  . Attends Religious Services: Not on file  . Active Member of Clubs or Organizations: Not on file  . Attends Banker Meetings: Not on file  . Marital Status: Not on file  Intimate Partner Violence:   . Fear of Current or Ex-Partner: Not on file  . Emotionally Abused: Not on file  . Physically Abused: Not on file  . Sexually Abused: Not on file    Family History  Problem Relation Age of Onset  . Hypertension Mother   . Breast cancer Mother 36  . Hypertension Father   . Hematuria Father   .  Colon cancer Other        Grandparents  <60  . Heart disease Other        Grandparents  . Diabetes Maternal Grandmother   . Kidney disease Neg Hx   . Bladder Cancer Neg Hx   . Kidney cancer Neg Hx     Allergies  Allergen Reactions  . Naproxen     REACTION: Rash  . Bextra [Valdecoxib] Rash  . Metaxalone Rash    REACTION: Rash    Medication list reviewed and updated in full in [facility].   GEN: No acute illnesses, no fevers, chills. GI: No n/v/d, eating normally Pulm: No SOB Interactive and getting along well at home.  Otherwise, ROS is as per the HPI.  Objective:   BP (!) 158/88   Pulse (!) 106   Temp 97.8 F (36.6 C) (Temporal)   Ht 5' 6.5" (1.689 m)   Wt 260 lb (117.9 kg)   SpO2 98%   BMI 41.34 kg/m   GEN: WDWN, NAD, Non-toxic, A & O x 3 HEENT: Atraumatic, Normocephalic. Neck supple. No masses, No LAD. Ears and Nose: No external deformity. CV: RRR, No M/G/R. No JVD. No thrill. No extra heart sounds. PULM: CTA B, no wheezes, crackles, rhonchi. No retractions. No resp. distress. No accessory muscle use. EXTR: No c/c/e NEURO Normal gait.  PSYCH: Normally interactive. Conversant. Not depressed or anxious appearing.  Calm demeanor.   Diabetic foot exam: Normal inspection No skin breakdown No calluses  Normal DP pulses Normal sensation to light tough Nails normal   Laboratory and Imaging Data: Results for orders placed or performed in visit on 08/09/19  CBC with Differential/Platelet  Result Value Ref Range   WBC 7.8 3.4 - 10.8 x10E3/uL   RBC 5.00 3.77 - 5.28 x10E6/uL   Hemoglobin 14.3 11.1 - 15.9 g/dL   Hematocrit 03.5 00.9 - 46.6 %   MCV 84 79 - 97 fL   MCH 28.6 26.6 - 33.0 pg   MCHC 33.9 31.5 - 35.7 g/dL   RDW 38.1 82.9 - 93.7 %   Platelets 290 150 - 450 x10E3/uL   Neutrophils 48 Not Estab. %   Lymphs 39 Not Estab. %   Monocytes 9 Not Estab. %   Eos 3 Not Estab. %   Basos 1 Not Estab. %   Neutrophils Absolute 3.7 1.4 - 7.0 x10E3/uL    Lymphocytes Absolute 3.1 0.7 - 3.1 x10E3/uL   Monocytes Absolute 0.7 0.1 - 0.9 x10E3/uL   EOS (ABSOLUTE) 0.2 0.0 - 0.4 x10E3/uL   Basophils Absolute 0.1 0.0 - 0.2 x10E3/uL   Immature Granulocytes 0 Not Estab. %   Immature Grans (Abs) 0.0 0.0 - 0.1 x10E3/uL  Comprehensive metabolic panel  Result Value Ref Range   Glucose 188 (H) 65 - 99 mg/dL   BUN 15 6 - 24 mg/dL   Creatinine, Ser 1.69 0.57 - 1.00 mg/dL   GFR calc non Af Amer 97 >59 mL/min/1.73   GFR calc Af Amer 112 >59 mL/min/1.73   BUN/Creatinine Ratio 21 9 - 23   Sodium 137 134 - 144 mmol/L   Potassium 4.6 3.5 - 5.2 mmol/L   Chloride 95 (L) 96 - 106 mmol/L   CO2 24 20 - 29 mmol/L   Calcium 10.1 8.7 - 10.2 mg/dL   Total Protein 6.5 6.0 - 8.5 g/dL   Albumin 4.3 3.8 - 4.9 g/dL   Globulin, Total 2.2 1.5 - 4.5 g/dL   Albumin/Globulin Ratio 2.0 1.2 - 2.2   Bilirubin Total 0.5 0.0 - 1.2 mg/dL   Alkaline Phosphatase 69 39 - 117 IU/L   AST 34 0 - 40 IU/L   ALT 29 0 - 32 IU/L  Lipid Panel w/o Chol/HDL Ratio  Result Value Ref Range   Cholesterol, Total 110 100 - 199 mg/dL   Triglycerides 678 (H) 0 - 149 mg/dL   HDL 43 >93 mg/dL   VLDL Cholesterol Cal 33 5 - 40 mg/dL   LDL Chol Calc (NIH) 34 0 - 99 mg/dL  Hepatic function panel  Result Value Ref Range   Bilirubin, Direct 0.14 0.00 - 0.40 mg/dL  Microalbumin / creatinine urine ratio  Result Value Ref Range  Creatinine, Urine 233.2 Not Estab. mg/dL   Microalbumin, Urine 10.2 Not Estab. ug/mL   Microalb/Creat Ratio 10 0 - 29 mg/g creat  Hgb A1c w/o eAG  Result Value Ref Range   Hgb A1c MFr Bld 7.2 (H) 4.8 - 5.6 %  VITAMIN D 25 Hydroxy (Vit-D Deficiency, Fractures)  Result Value Ref Range   Vit D, 25-Hydroxy 28.2 (L) 30.0 - 100.0 ng/mL  Specimen status report  Result Value Ref Range   specimen status report Comment      Assessment and Plan:     ICD-10-CM   1. Controlled type 2 diabetes mellitus without complication, without long-term current use of insulin (HCC)  E11.9     2. Hyperlipidemia LDL goal <70  E78.5   3. Morbid obesity with BMI of 40.0-44.9, adult (HCC)  E66.01    Z68.41   4. Diabetes mellitus without complication (HCC)  E11.9 metFORMIN (GLUCOPHAGE-XR) 500 MG 24 hr tablet   Globally doing well.  a1c at 7.2 All vaccines up to date BP at home normal - very much anxiety  Follow-up: Return for 9 month follow-up with complete physical .  Meds ordered this encounter  Medications  . metFORMIN (GLUCOPHAGE-XR) 500 MG 24 hr tablet    Sig: Take 3 tablets (1,500 mg total) by mouth daily with breakfast.    Dispense:  270 tablet    Refill:  3   No orders of the defined types were placed in this encounter.   Signed,  Elpidio Galea. Danil Wedge, MD   Outpatient Encounter Medications as of 08/14/2019  Medication Sig  . hydrochlorothiazide (HYDRODIURIL) 25 MG tablet Take 1 tablet (25 mg total) by mouth daily.  Marland Kitchen lisinopril (ZESTRIL) 40 MG tablet Take 1 tablet (40 mg total) by mouth daily.  . metFORMIN (GLUCOPHAGE-XR) 500 MG 24 hr tablet Take 3 tablets (1,500 mg total) by mouth daily with breakfast.  . NONFORMULARY OR COMPOUNDED ITEM Epic Account: [id number] FLP, E78.5 hyperlipidemia A1c, E11.9 diabetes mellitus type 2 Urine microalbumin, E11.9 diabetes mellitus type 2 Cbc with diff, HFP, BMET: Z79.899 long term medication usage Vit D: E55.9 low vitamin d levels  . rosuvastatin (CRESTOR) 40 MG tablet Take 1 tablet (40 mg total) by mouth daily.  . [DISCONTINUED] metFORMIN (GLUCOPHAGE-XR) 500 MG 24 hr tablet Take 3 tablets (1,500 mg total) by mouth daily with breakfast.  . [DISCONTINUED] glucose blood (ONETOUCH ULTRA) test strip Use to check blood sugar two times a day.   No facility-administered encounter medications on file as of 08/14/2019.

## 2019-08-14 ENCOUNTER — Other Ambulatory Visit: Payer: Self-pay

## 2019-08-14 ENCOUNTER — Encounter: Payer: Self-pay | Admitting: Family Medicine

## 2019-08-14 ENCOUNTER — Ambulatory Visit: Payer: Managed Care, Other (non HMO) | Admitting: Family Medicine

## 2019-08-14 VITALS — BP 158/88 | HR 106 | Temp 97.8°F | Ht 66.5 in | Wt 260.0 lb

## 2019-08-14 DIAGNOSIS — E785 Hyperlipidemia, unspecified: Secondary | ICD-10-CM

## 2019-08-14 DIAGNOSIS — Z6841 Body Mass Index (BMI) 40.0 and over, adult: Secondary | ICD-10-CM | POA: Diagnosis not present

## 2019-08-14 DIAGNOSIS — E119 Type 2 diabetes mellitus without complications: Secondary | ICD-10-CM

## 2019-08-14 MED ORDER — METFORMIN HCL ER 500 MG PO TB24: 1500.0000 mg | ORAL_TABLET | Freq: Every day | ORAL | 3 refills | Status: DC

## 2019-10-03 LAB — HM DIABETES EYE EXAM

## 2019-10-06 ENCOUNTER — Encounter: Payer: Self-pay | Admitting: Family Medicine

## 2020-01-25 ENCOUNTER — Encounter: Payer: Self-pay | Admitting: Family Medicine

## 2020-01-25 DIAGNOSIS — Z9189 Other specified personal risk factors, not elsewhere classified: Secondary | ICD-10-CM

## 2020-01-29 MED ORDER — NONFORMULARY OR COMPOUNDED ITEM: 0 refills | Status: DC

## 2020-01-29 NOTE — Telephone Encounter (Signed)
Ready - we can mail or she can pick up

## 2020-03-07 ENCOUNTER — Other Ambulatory Visit: Payer: Self-pay | Admitting: Family Medicine

## 2020-03-07 DIAGNOSIS — I10 Essential (primary) hypertension: Secondary | ICD-10-CM

## 2020-03-07 DIAGNOSIS — E7800 Pure hypercholesterolemia, unspecified: Secondary | ICD-10-CM

## 2020-03-13 ENCOUNTER — Other Ambulatory Visit: Payer: Self-pay | Admitting: Family Medicine

## 2020-03-14 LAB — HEPATIC FUNCTION PANEL
ALT: 33 IU/L — ABNORMAL HIGH (ref 0–32)
AST: 25 IU/L (ref 0–40)
Albumin: 4.5 g/dL (ref 3.8–4.9)
Alkaline Phosphatase: 69 IU/L (ref 48–121)
Bilirubin Total: 0.6 mg/dL (ref 0.0–1.2)
Bilirubin, Direct: 0.17 mg/dL (ref 0.00–0.40)
Total Protein: 6.9 g/dL (ref 6.0–8.5)

## 2020-03-14 LAB — MICROALBUMIN / CREATININE URINE RATIO
Creatinine, Urine: 130.1 mg/dL
Microalb/Creat Ratio: 11 mg/g creat (ref 0–29)
Microalbumin, Urine: 14.9 ug/mL

## 2020-03-14 LAB — CBC WITH DIFFERENTIAL/PLATELET
Basophils Absolute: 0.1 10*3/uL (ref 0.0–0.2)
Basos: 1 %
EOS (ABSOLUTE): 0.3 10*3/uL (ref 0.0–0.4)
Eos: 4 %
Hematocrit: 44.3 % (ref 34.0–46.6)
Hemoglobin: 14.9 g/dL (ref 11.1–15.9)
Immature Grans (Abs): 0 10*3/uL (ref 0.0–0.1)
Immature Granulocytes: 0 %
Lymphocytes Absolute: 3.5 10*3/uL — ABNORMAL HIGH (ref 0.7–3.1)
Lymphs: 46 %
MCH: 28.2 pg (ref 26.6–33.0)
MCHC: 33.6 g/dL (ref 31.5–35.7)
MCV: 84 fL (ref 79–97)
Monocytes Absolute: 0.7 10*3/uL (ref 0.1–0.9)
Monocytes: 9 %
Neutrophils Absolute: 3.1 10*3/uL (ref 1.4–7.0)
Neutrophils: 40 %
Platelets: 263 10*3/uL (ref 150–450)
RBC: 5.29 x10E6/uL — ABNORMAL HIGH (ref 3.77–5.28)
RDW: 13.4 % (ref 11.7–15.4)
WBC: 7.6 10*3/uL (ref 3.4–10.8)

## 2020-03-14 LAB — LIPID PANEL W/O CHOL/HDL RATIO
Cholesterol, Total: 139 mg/dL (ref 100–199)
HDL: 48 mg/dL (ref >39)
LDL Chol Calc (NIH): 53 mg/dL (ref 0–99)
Triglycerides: 239 mg/dL — ABNORMAL HIGH (ref 0–149)
VLDL Cholesterol Cal: 38 mg/dL (ref 5–40)

## 2020-03-14 LAB — BASIC METABOLIC PANEL
BUN/Creatinine Ratio: 27 — ABNORMAL HIGH (ref 9–23)
BUN: 23 mg/dL (ref 6–24)
CO2: 23 mmol/L (ref 20–29)
Calcium: 9.7 mg/dL (ref 8.7–10.2)
Chloride: 97 mmol/L (ref 96–106)
Creatinine, Ser: 0.84 mg/dL (ref 0.57–1.00)
GFR calc Af Amer: 92 mL/min/{1.73_m2} (ref >59)
GFR calc non Af Amer: 80 mL/min/{1.73_m2} (ref >59)
Glucose: 188 mg/dL — ABNORMAL HIGH (ref 65–99)
Potassium: 4.5 mmol/L (ref 3.5–5.2)
Sodium: 139 mmol/L (ref 134–144)

## 2020-03-14 LAB — HGB A1C W/O EAG: Hgb A1c MFr Bld: 7.7 % — ABNORMAL HIGH (ref 4.8–5.6)

## 2020-03-14 LAB — SARS-COV-2 SEMI-QUANTITATIVE TOTAL ANTIBODY, SPIKE
SARS-CoV-2 Semi-Quant Total Ab: 437.3 U/mL (ref <0.8)
SARS-CoV-2 Spike Ab Interp: POSITIVE

## 2020-03-15 ENCOUNTER — Other Ambulatory Visit: Payer: Self-pay | Admitting: *Deleted

## 2020-03-17 NOTE — Progress Notes (Signed)
Joyce Rowe T. Joyce Kugler, MD, CAQ Sports Medicine  Primary Care and Sports Medicine [facility] at [facility] [address] [place], [address]  Phone: [phone]  FAX: [phone]  Joyce Rowe - 54 y.o. female  MRN [id number]  Date of Birth: 1966/02/04  Date: 03/18/2020  PCP: Joyce Beat, MD  Referral: Joyce Beat, MD  Chief Complaint  Patient presents with  . Biometric Screening Form    This visit occurred during the SARS-CoV-2 public health emergency.  Safety protocols were in place, including screening questions prior to the visit, additional usage of staff PPE, and extensive cleaning of exam room while observing appropriate contact time as indicated for disinfecting solutions.   Subjective:   Joyce Rowe is a 54 y.o. very pleasant female patient with Body mass index is 40.7 kg/m. who presents with the following:  Form completion. BP 120/80 at home  Diabetes Mellitus: Tolerating Medications: yes Compliance with diet: fair, Body mass index is 40.7 kg/m. Exercise: minimal / intermittent Avg blood sugars at home: not checking Foot problems: none Hypoglycemia: none No nausea, vomitting, blurred vision, polyuria.  Lab Results  Component Value Date   HGBA1C 7.7 (H) 03/13/2020   HGBA1C 7.2 (H) 08/09/2019   HGBA1C 8.0 (H) 05/08/2019   Lab Results  Component Value Date   LDLCALC 53 03/13/2020   CREATININE 0.84 03/13/2020    Wt Readings from Last 3 Encounters:  03/18/20 256 lb (116.1 kg)  08/14/19 260 lb (117.9 kg)  05/15/19 262 lb 8 oz (119.1 kg)    HTN: Tolerating all medications without side effects Stable and at goal No CP, no sob. No HA.  BP Readings from Last 3 Encounters:  03/18/20 (!) 160/82  08/14/19 (!) 158/88  05/15/19 (!) 178/88    Basic Metabolic Panel:    Component Value Date/Time   NA 139 03/13/2020 0804   K 4.5 03/13/2020 0804   CL 97 03/13/2020 0804   CO2 23 03/13/2020 0804   BUN 23 03/13/2020  0804   CREATININE 0.84 03/13/2020 0804   GLUCOSE 188 (H) 03/13/2020 0804   CALCIUM 9.7 03/13/2020 0804     Immunization History  Administered Date(s) Administered  . Influenza,inj,Quad PF,6+ Mos 05/13/2015, 05/15/2019  . Pneumococcal Polysaccharide-23 05/13/2015  . Tdap 03/28/2014    Health Maintenance  Topic Date Due  . Hepatitis C Screening  Never done  . COVID-19 Vaccine (1) Never done  . HIV Screening  Never done  . INFLUENZA VACCINE  03/10/2020  . MAMMOGRAM  05/04/2020  . FOOT EXAM  08/13/2020  . HEMOGLOBIN A1C  09/13/2020  . OPHTHALMOLOGY EXAM  10/02/2020  . Fecal DNA (Cologuard)  03/01/2021  . PAP SMEAR-Modifier  02/24/2023  . TETANUS/TDAP  03/28/2024  . PNEUMOCOCCAL POLYSACCHARIDE VACCINE AGE 28-64 HIGH RISK  Completed     Review of Systems is noted in the HPI, as appropriate  Objective:   BP (!) 160/82   Pulse 97   Temp (!) 97.1 F (36.2 C) (Temporal)   Ht 5' 6.5" (1.689 m)   Wt 256 lb (116.1 kg)   SpO2 98%   BMI 40.70 kg/m   My bp, 140/80  GEN: No acute distress; alert,appropriate. PULM: Breathing comfortably in no respiratory distress PSYCH: Normally interactive.  CV: RRR, no m/g/r   Laboratory and Imaging Data: Results for orders placed or performed in visit on 03/13/20  CBC with Differential/Platelet  Result Value Ref Range   WBC 7.6 3.4 - 10.8 x10E3/uL  RBC 5.29 (H) 3.77 - 5.28 x10E6/uL   Hemoglobin 14.9 11.1 - 15.9 g/dL   Hematocrit 15.1 76.1 - 46.6 %   MCV 84 79 - 97 fL   MCH 28.2 26.6 - 33.0 pg   MCHC 33.6 31 - 35 g/dL   RDW 60.7 37.1 - 06.2 %   Platelets 263 150 - 450 x10E3/uL   Neutrophils 40 Not Estab. %   Lymphs 46 Not Estab. %   Monocytes 9 Not Estab. %   Eos 4 Not Estab. %   Basos 1 Not Estab. %   Neutrophils Absolute 3.1 1 - 7 x10E3/uL   Lymphocytes Absolute 3.5 (H) 0 - 3 x10E3/uL   Monocytes Absolute 0.7 0 - 0 x10E3/uL   EOS (ABSOLUTE) 0.3 0.0 - 0.4 x10E3/uL   Basophils Absolute 0.1 0 - 0 x10E3/uL   Immature Granulocytes  0 Not Estab. %   Immature Grans (Abs) 0.0 0.0 - 0.1 x10E3/uL  Basic metabolic panel  Result Value Ref Range   Glucose 188 (H) 65 - 99 mg/dL   BUN 23 6 - 24 mg/dL   Creatinine, Ser 6.94 0.57 - 1.00 mg/dL   GFR calc non Af Amer 80 >59 mL/min/1.73   GFR calc Af Amer 92 >59 mL/min/1.73   BUN/Creatinine Ratio 27 (H) 9 - 23   Sodium 139 134 - 144 mmol/L   Potassium 4.5 3.5 - 5.2 mmol/L   Chloride 97 96 - 106 mmol/L   CO2 23 20 - 29 mmol/L   Calcium 9.7 8.7 - 10.2 mg/dL  Lipid Panel w/o Chol/HDL Ratio  Result Value Ref Range   Cholesterol, Total 139 100 - 199 mg/dL   Triglycerides 854 (H) 0 - 149 mg/dL   HDL 48 >62 mg/dL   VLDL Cholesterol Cal 38 5 - 40 mg/dL   LDL Chol Calc (NIH) 53 0 - 99 mg/dL  Hepatic function panel  Result Value Ref Range   Total Protein 6.9 6.0 - 8.5 g/dL   Albumin 4.5 3.8 - 4.9 g/dL   Bilirubin Total 0.6 0.0 - 1.2 mg/dL   Bilirubin, Direct 7.03 0.00 - 0.40 mg/dL   Alkaline Phosphatase 69 48 - 121 IU/L   AST 25 0 - 40 IU/L   ALT 33 (H) 0 - 32 IU/L  Microalbumin / creatinine urine ratio  Result Value Ref Range   Creatinine, Urine WILL FOLLOW    Microalbumin, Urine WILL FOLLOW    Microalb/Creat Ratio WILL FOLLOW   SARS-CoV-2 Semi-Quantitative Total Antibody, Spike  Result Value Ref Range   SARS-CoV-2 Semi-Quant Total Ab 437.3 <0.8 U/mL   SARS-CoV-2 Spike Ab Interp Positive   Hgb A1c w/o eAG  Result Value Ref Range   Hgb A1c MFr Bld 7.7 (H) 4.8 - 5.6 %     Assessment and Plan:     ICD-10-CM   1. Controlled type 2 diabetes mellitus without complication, without long-term current use of insulin (HCC)  E11.9   2. Essential hypertension  I10    Conditions are stable.  No medication changes.  Continue working on diet and exercise.  Continue to follow blood pressure and blood sugar at home.  Blood pressure slightly elevated as the patient arrived, she normally has some extreme whitecoat hypertension.  On my recheck, this is approaching normal.  120/80 at  home.  Reliable patient.  Biometric form completed.  Follow-up: 1 year  No orders of the defined types were placed in this encounter.  There are no discontinued medications. No orders  of the defined types were placed in this encounter.   Signed,  Elpidio Galea. Pike Scantlebury, MD   Outpatient Encounter Medications as of 03/18/2020  Medication Sig  . hydrochlorothiazide (HYDRODIURIL) 25 MG tablet TAKE 1 TABLET BY MOUTH  DAILY  . lisinopril (ZESTRIL) 40 MG tablet TAKE 1 TABLET BY MOUTH  DAILY  . metFORMIN (GLUCOPHAGE-XR) 500 MG 24 hr tablet Take 3 tablets (1,500 mg total) by mouth daily with breakfast.  . rosuvastatin (CRESTOR) 40 MG tablet TAKE 1 TABLET BY MOUTH  DAILY  . SODIUM FLUORIDE 5000 SENSITIVE 1.1-5 % PSTE Take by mouth daily.   No facility-administered encounter medications on file as of 03/18/2020.

## 2020-03-18 ENCOUNTER — Other Ambulatory Visit: Payer: Self-pay

## 2020-03-18 ENCOUNTER — Ambulatory Visit (INDEPENDENT_AMBULATORY_CARE_PROVIDER_SITE_OTHER): Payer: Managed Care, Other (non HMO) | Admitting: Family Medicine

## 2020-03-18 ENCOUNTER — Encounter: Payer: Self-pay | Admitting: Family Medicine

## 2020-03-18 VITALS — BP 140/80 | HR 97 | Temp 97.1°F | Ht 66.5 in | Wt 256.0 lb

## 2020-03-18 DIAGNOSIS — E119 Type 2 diabetes mellitus without complications: Secondary | ICD-10-CM

## 2020-03-18 DIAGNOSIS — I10 Essential (primary) hypertension: Secondary | ICD-10-CM | POA: Diagnosis not present

## 2020-03-21 ENCOUNTER — Other Ambulatory Visit: Payer: Self-pay | Admitting: Family Medicine

## 2020-03-21 DIAGNOSIS — Z1231 Encounter for screening mammogram for malignant neoplasm of breast: Secondary | ICD-10-CM

## 2020-05-07 ENCOUNTER — Other Ambulatory Visit: Payer: Self-pay

## 2020-05-07 ENCOUNTER — Ambulatory Visit
Admission: RE | Admit: 2020-05-07 | Discharge: 2020-05-07 | Disposition: A | Discharge status: Home / Self Care | Payer: Managed Care, Other (non HMO) | Source: Ambulatory Visit | Attending: Family Medicine | Admitting: Family Medicine

## 2020-05-07 DIAGNOSIS — Z1231 Encounter for screening mammogram for malignant neoplasm of breast: Secondary | ICD-10-CM | POA: Insufficient documentation

## 2020-06-12 ENCOUNTER — Other Ambulatory Visit: Payer: Self-pay | Admitting: Family Medicine

## 2020-06-12 DIAGNOSIS — E7800 Pure hypercholesterolemia, unspecified: Secondary | ICD-10-CM

## 2020-06-12 DIAGNOSIS — E119 Type 2 diabetes mellitus without complications: Secondary | ICD-10-CM

## 2020-06-12 DIAGNOSIS — I10 Essential (primary) hypertension: Secondary | ICD-10-CM

## 2020-06-13 MED ORDER — METFORMIN HCL ER 500 MG PO TB24: 1500.0000 mg | ORAL_TABLET | Freq: Every day | ORAL | 2 refills | Status: DC

## 2020-06-13 NOTE — Addendum Note (Signed)
Addended by: Damita Lack on: 06/13/2020 10:12 AM   Modules accepted: Orders

## 2020-12-06 ENCOUNTER — Ambulatory Visit (INDEPENDENT_AMBULATORY_CARE_PROVIDER_SITE_OTHER): Payer: Managed Care, Other (non HMO) | Admitting: Obstetrics and Gynecology

## 2020-12-06 ENCOUNTER — Other Ambulatory Visit: Payer: Self-pay

## 2020-12-06 ENCOUNTER — Other Ambulatory Visit (HOSPITAL_COMMUNITY)
Admission: RE | Admit: 2020-12-06 | Discharge: 2020-12-06 | Disposition: A | Discharge status: Home / Self Care | Payer: Managed Care, Other (non HMO) | Source: Ambulatory Visit | Attending: Obstetrics and Gynecology | Admitting: Obstetrics and Gynecology

## 2020-12-06 ENCOUNTER — Encounter: Payer: Self-pay | Admitting: Obstetrics and Gynecology

## 2020-12-06 VITALS — BP 144/88 | HR 102 | Ht 68.0 in | Wt 258.0 lb

## 2020-12-06 DIAGNOSIS — Z01411 Encounter for gynecological examination (general) (routine) with abnormal findings: Secondary | ICD-10-CM | POA: Diagnosis not present

## 2020-12-06 DIAGNOSIS — Z7185 Encounter for immunization safety counseling: Secondary | ICD-10-CM | POA: Diagnosis not present

## 2020-12-06 DIAGNOSIS — Z1231 Encounter for screening mammogram for malignant neoplasm of breast: Secondary | ICD-10-CM

## 2020-12-06 DIAGNOSIS — Z124 Encounter for screening for malignant neoplasm of cervix: Secondary | ICD-10-CM

## 2020-12-06 DIAGNOSIS — Z0000 Encounter for general adult medical examination without abnormal findings: Secondary | ICD-10-CM | POA: Diagnosis not present

## 2020-12-06 DIAGNOSIS — Z1239 Encounter for other screening for malignant neoplasm of breast: Secondary | ICD-10-CM

## 2020-12-06 DIAGNOSIS — N764 Abscess of vulva: Secondary | ICD-10-CM

## 2020-12-06 NOTE — Progress Notes (Signed)
Gynecology Annual Exam  PCP: Hannah Beat, MD  Chief Complaint:  Chief Complaint  Patient presents with  . Gynecologic Exam    Annual - lump on inside of vagina x 2 wks. RM 5    History of Present Illness:Patient is a 55 y.o. G2P1011 presents for annual exam. The patient reports concern today regarding a lump on her R labia which she first noticed about 1 week ago after IC. She reports the area felt "sore" after IC and that subsequently, she has had persistent discomfort at the site with wiping. She denies any urinary S&S or any irritation/burning at site.   LMP: No LMP recorded. Patient is perimenopausal. Last LMP was in 12/2019 Heavy Menses: not applicable Clots: not applicable Intermenstrual Bleeding: not applicable Postcoital Bleeding: no Dysmenorrhea: not applicable  The patient is sexually active. She denies dyspareunia. There is notable family history of breast or ovarian cancer in her family - mother at age 56  The patient wears seatbelts: yes.   The patient has regular exercise: yes.    The patient denies current symptoms of depression.     Review of Systems: Review of Systems  Constitutional: Negative.   HENT: Negative.   Eyes: Negative.   Respiratory: Negative.   Cardiovascular: Negative.   Gastrointestinal: Negative.   Genitourinary: Negative.   Musculoskeletal: Negative.   Skin: Negative.   Neurological: Negative.   Endo/Heme/Allergies: Negative.   Psychiatric/Behavioral: Negative.     Past Medical History:  Patient Active Problem List   Diagnosis Date Noted  . Post-menopausal bleeding 09/13/2018  . Kidney stones 12/28/2015  . Cysts of both ovaries 12/28/2015  . Essential hypertension 08/21/2015  . Controlled type 2 diabetes mellitus without complication, without long-term current use of insulin (HCC) 04/03/2015  . Hyperlipidemia LDL goal <70 03/28/2014  . Morbid obesity with BMI of 40.0-44.9, adult (HCC) 03/28/2014    Past Surgical History:   Past Surgical History:  Procedure Laterality Date  . CESAREAN SECTION  08/2002  . EXTRACORPOREAL SHOCK WAVE LITHOTRIPSY Left 12/12/2015   Procedure: EXTRACORPOREAL SHOCK WAVE LITHOTRIPSY (ESWL);  Surgeon: Vanna Scotland, MD;  Location: [facility] ORS;  Service: Urology;  Laterality: Left;  . LAPAROSCOPIC BILATERAL SALPINGECTOMY Bilateral 02/27/2016   Procedure: LAPAROSCOPIC BILATERAL SALPINGECTOMY;  Surgeon: Conard Novak, MD;  Location: [facility] OR[facility];  Service: Gynecology;  Laterality: Bilateral;  . LAPAROSCOPIC OVARIAN CYSTECTOMY Right 02/27/2016   Procedure: LAPAROSCOPIC OVARIAN CYSTECTOMY;  Surgeon: Conard Novak, MD;  Location: [facility] OR[facility];  Service: Gynecology;  Laterality: Right;  . tubes in ears     70's    Gynecologic History:  No LMP recorded. Patient is perimenopausal. Last Pap: 2019 Results were: NILM/HPV -  Last mammogram: 04/2020 Results were: BI-RAD I  Obstetric History: G2P1011  Family History:  Family History  Problem Relation Age of Onset  . Hypertension Mother   . Breast cancer Mother 27  . Hypertension Father   . Hematuria Father   . Colon cancer Other        Grandparents  <60  . Heart disease Other        Grandparents  . Diabetes Maternal Grandmother   . Kidney disease Neg Hx   . Bladder Cancer Neg Hx   . Kidney cancer Neg Hx     Social History:  Social History   Socioeconomic History  . Marital status: Married    Spouse name: Not on file  . Number of children: 1  . Years of education: Not on file  .  Highest education level: Not on file  Occupational History  . 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 Smoking status: Never Smoker  . Smokeless tobacco: Never Used  Vaping Use  . Vaping Use: Never used  Substance and Sexual Activity  . Alcohol use: No  . Drug use: No  . Sexual activity: Yes    Birth control/protection: Surgical    Comment: Tubal  Other Topics Concern  . Not on file  Social History Narrative   Married      1 child      BS, SAHM       6-8 hours sleep per night      3 people living in home      Regular exercise-yes   Social Determinants of Health   Financial Resource Strain: Not on file  Food Insecurity: Not on file  Transportation Needs: Not on file  Physical Activity: Not on file  Stress: Not on file  Social Connections: Not on file  Intimate Partner Violence: Not on file    Allergies:  Allergies  Allergen Reactions  . Naproxen     REACTION: Rash  . Bextra [Valdecoxib] Rash  . Metaxalone Rash    REACTION: Rash    Medications: Prior to Admission medications   Medication Sig Start Date End Date Taking? Authorizing Provider  hydrochlorothiazide (HYDRODIURIL) 25 MG tablet TAKE 1 TABLET BY MOUTH  DAILY 06/13/20  Yes Copland, Karleen Hampshire, MD  lisinopril (ZESTRIL) 40 MG tablet TAKE 1 TABLET BY MOUTH  DAILY 06/13/20  Yes Copland, Karleen Hampshire, MD  metFORMIN (GLUCOPHAGE-XR) 500 MG 24 hr tablet Take 3 tablets (1,500 mg total) by mouth daily with breakfast. 06/13/20  Yes Copland, Karleen Hampshire, MD  rosuvastatin (CRESTOR) 40 MG tablet TAKE 1 TABLET BY MOUTH  DAILY 06/13/20  Yes Copland, Spencer, MD    Physical Exam Vitals: Blood pressure (!) 144/88, pulse (!) 102, height 5\' 8"  (1.727 m), weight 258 lb (117 kg).  General: Patient tearful regarding exam today - no acute distress noted HEENT: normocephalic, anicteric Thyroid: no enlargement, no palpable nodules Pulmonary: No increased work of breathing, CTAB Cardiovascular: RRR, distal pulses 2+ Breast: Breast symmetrical, no tenderness, no palpable nodules or masses, no skin or nipple retraction present, no nipple discharge.  No axillary or supraclavicular lymphadenopathy. Abdomen: NABS, soft, non-tender, non-distended.  Umbilicus without lesions.  No hepatomegaly, splenomegaly or masses palpable. No evidence of hernia  Genitourinary:  External: Normal external female genitalia.  Normal urethral meatus, normal Bartholin's and Skene's glands.  Small bump (0.5 cm diameter) noted  on R labia majora with thickened, purulent drainage forming a head - drainage was easily expressed from site with slight pressure applied - following drainage a dilated follicle was assessed with no further evidence of additional drainage at site  Vagina: Normal vaginal mucosa, no evidence of prolapse.    Cervix: Grossly normal in appearance, no bleeding  Bimanual deferred  Rectal: deferred  Lymphatic: no evidence of inguinal lymphadenopathy Extremities: no edema, erythema, or tenderness Neurologic: Grossly intact Psychiatric: mood appropriate, affect full   Assessment: 55 y.o. G2P1011 routine annual exam, R labial furuncle on exam  Plan: Problem List Items Addressed This Visit   None   Visit Diagnoses    Screening for cervical cancer    -  Primary   Relevant Orders   Cytology - PAP   Immunization counseling       Routine health maintenance       Encounter for gynecological examination with abnormal finding  Screening breast examination       Screening mammogram for breast cancer       Relevant Orders   MM DIGITAL SCREENING BILATERAL   Furuncle of labia majora          1) Mammogram - recommend yearly screening mammogram.  Mammogram is currently up to date - ordered for September placed  2) STI screening  wasoffered and declined  3) ASCCP guidelines and rational discussed. Will obtain today and patient opts for subsequently every 5 year interval  4) Routine healthcare maintenance including cholesterol, diabetes screening discussed managed by PCP  6) Colonoscopy is note up to date - patient has recently completed cologuard screening through her PCP. She declines referral for coloscopy today  Screening recommended starting at age 55 for average risk individuals, age 71 for individuals deemed at increased risk (including African Americans) and recommended to continue until age 64.  For patient age 52-85 individualized approach is recommended.  Gold standard screening is via  colonoscopy, Cologuard screening is an acceptable alternative for patient unwilling or unable to undergo colonoscopy.  "Colorectal cancer screening for average?risk adults: 2018 guideline update from the American Cancer Society"CA: A Cancer Journal for Clinicians: Jan 06, 2017   7) Right labial lump - assessment consistent with furuncle - drainage expressed readily during exam, no further presence of drainage following expression - reviewed findings with patient - if she notes lump returns or symptoms to persist she will follow-up  8) Return in about 1 year (around 12/06/2021), or if symptoms worsen or fail to improve.    Zipporah Plants, CNM, MSN Domingo Pulse, [facility] 12/06/2020, 11:35 AM

## 2020-12-09 LAB — CYTOLOGY - PAP
Comment: NEGATIVE
Diagnosis: NEGATIVE
High risk HPV: NEGATIVE

## 2020-12-18 ENCOUNTER — Other Ambulatory Visit: Payer: Self-pay | Admitting: Family Medicine

## 2020-12-18 DIAGNOSIS — E119 Type 2 diabetes mellitus without complications: Secondary | ICD-10-CM

## 2021-02-12 DIAGNOSIS — U071 COVID-19: Secondary | ICD-10-CM

## 2021-02-17 MED ORDER — NONFORMULARY OR COMPOUNDED ITEM: 0 refills | Status: DC

## 2021-02-17 NOTE — Telephone Encounter (Signed)
I put the orders on your desk.  Can you help make sure that they are sent to [facility]?  Meds ordered this encounter  Medications   NONFORMULARY OR COMPOUNDED ITEM    Sig: Epic Account: [id number] Lab Studies: BMP, CBC with diff, HFP: Z79.899 [id number] - SARS-CoV-2 Semi-Quant Total Ab: z78.9 FLP, E78.5 hyperlipidemia A1c, E11.9 diabetes mellitus type 2 Urine microalbumin, E11.9 diabetes mellitus type 2    Dispense:  1 each    Refill:  0

## 2021-02-17 NOTE — Telephone Encounter (Addendum)
Lab orders mailed

## 2021-02-18 ENCOUNTER — Other Ambulatory Visit: Payer: Self-pay | Admitting: Family Medicine

## 2021-02-18 DIAGNOSIS — I10 Essential (primary) hypertension: Secondary | ICD-10-CM

## 2021-02-18 DIAGNOSIS — E7800 Pure hypercholesterolemia, unspecified: Secondary | ICD-10-CM

## 2021-02-18 DIAGNOSIS — E119 Type 2 diabetes mellitus without complications: Secondary | ICD-10-CM

## 2021-03-10 ENCOUNTER — Other Ambulatory Visit: Payer: Self-pay | Admitting: Family Medicine

## 2021-03-11 LAB — LIPID PANEL W/O CHOL/HDL RATIO
Cholesterol, Total: 115 mg/dL (ref 100–199)
HDL: 42 mg/dL (ref >39)
LDL Chol Calc (NIH): 35 mg/dL (ref 0–99)
Triglycerides: 250 mg/dL — ABNORMAL HIGH (ref 0–149)
VLDL Cholesterol Cal: 38 mg/dL (ref 5–40)

## 2021-03-11 LAB — CBC WITH DIFFERENTIAL/PLATELET
Basophils Absolute: 0.1 10*3/uL (ref 0.0–0.2)
Basos: 1 %
EOS (ABSOLUTE): 0.6 10*3/uL — ABNORMAL HIGH (ref 0.0–0.4)
Eos: 7 %
Hematocrit: 44.9 % (ref 34.0–46.6)
Hemoglobin: 14.6 g/dL (ref 11.1–15.9)
Immature Grans (Abs): 0 10*3/uL (ref 0.0–0.1)
Immature Granulocytes: 0 %
Lymphocytes Absolute: 3.1 10*3/uL (ref 0.7–3.1)
Lymphs: 39 %
MCH: 27.9 pg (ref 26.6–33.0)
MCHC: 32.5 g/dL (ref 31.5–35.7)
MCV: 86 fL (ref 79–97)
Monocytes Absolute: 0.8 10*3/uL (ref 0.1–0.9)
Monocytes: 10 %
Neutrophils Absolute: 3.6 10*3/uL (ref 1.4–7.0)
Neutrophils: 43 %
Platelets: 287 10*3/uL (ref 150–450)
RBC: 5.23 x10E6/uL (ref 3.77–5.28)
RDW: 13.9 % (ref 11.7–15.4)
WBC: 8.1 10*3/uL (ref 3.4–10.8)

## 2021-03-11 LAB — BASIC METABOLIC PANEL
BUN/Creatinine Ratio: 19 (ref 9–23)
BUN: 17 mg/dL (ref 6–24)
CO2: 25 mmol/L (ref 20–29)
Calcium: 9.9 mg/dL (ref 8.7–10.2)
Chloride: 95 mmol/L — ABNORMAL LOW (ref 96–106)
Creatinine, Ser: 0.88 mg/dL (ref 0.57–1.00)
Glucose: 231 mg/dL — ABNORMAL HIGH (ref 65–99)
Potassium: 4.7 mmol/L (ref 3.5–5.2)
Sodium: 138 mmol/L (ref 134–144)
eGFR: 79 mL/min/{1.73_m2} (ref >59)

## 2021-03-11 LAB — HEPATIC FUNCTION PANEL
ALT: 34 IU/L — ABNORMAL HIGH (ref 0–32)
AST: 33 IU/L (ref 0–40)
Albumin: 4.5 g/dL (ref 3.8–4.9)
Alkaline Phosphatase: 69 IU/L (ref 44–121)
Bilirubin Total: 0.7 mg/dL (ref 0.0–1.2)
Bilirubin, Direct: 0.21 mg/dL (ref 0.00–0.40)
Total Protein: 6.6 g/dL (ref 6.0–8.5)

## 2021-03-11 LAB — SARS-COV-2 SEMI-QUANTITATIVE TOTAL ANTIBODY, SPIKE: SARS-CoV-2 Spike Ab Interp: POSITIVE

## 2021-03-11 LAB — SPECIMEN STATUS REPORT

## 2021-03-11 LAB — MICROALBUMIN / CREATININE URINE RATIO
Creatinine, Urine: 258.1 mg/dL
Microalb/Creat Ratio: 32 mg/g creat — ABNORMAL HIGH (ref 0–29)
Microalbumin, Urine: 82.8 ug/mL

## 2021-03-11 LAB — SARS-COV-2 SPIKE AB DILUTION: SARS-CoV-2 Spike Ab Dilution: 3819 U/mL (ref <0.8)

## 2021-03-11 LAB — HGB A1C W/O EAG: Hgb A1c MFr Bld: 8.3 % — ABNORMAL HIGH (ref 4.8–5.6)

## 2021-03-19 ENCOUNTER — Ambulatory Visit: Payer: Managed Care, Other (non HMO) | Admitting: Family Medicine

## 2021-03-19 ENCOUNTER — Other Ambulatory Visit: Payer: Self-pay

## 2021-03-19 ENCOUNTER — Encounter: Payer: Self-pay | Admitting: Family Medicine

## 2021-03-19 VITALS — BP 107/82 | HR 103 | Temp 97.2°F | Ht 67.0 in | Wt 259.0 lb

## 2021-03-19 DIAGNOSIS — Z23 Encounter for immunization: Secondary | ICD-10-CM | POA: Diagnosis not present

## 2021-03-19 DIAGNOSIS — E119 Type 2 diabetes mellitus without complications: Secondary | ICD-10-CM | POA: Diagnosis not present

## 2021-03-19 DIAGNOSIS — Z1211 Encounter for screening for malignant neoplasm of colon: Secondary | ICD-10-CM

## 2021-03-19 MED ORDER — CONTOUR NEXT TEST VI STRP
ORAL_STRIP | 3 refills | Status: DC
Start: 2021-03-19 — End: 2022-06-15

## 2021-03-19 MED ORDER — NONFORMULARY OR COMPOUNDED ITEM: 0 refills | Status: DC

## 2021-03-19 NOTE — Progress Notes (Signed)
Joyce Poss T. Jonasia Coiner, MD, [place] [facility] [place], [address]  Phone: [phone]  FAX: [phone]  Joyce Rowe - 55 y.o. female  MRN [id number]  Date of Birth: 1965-11-12  Date: 03/19/2021  PCP: Owens Loffler, MD  Referral: Owens Loffler, MD  Chief Complaint  Patient presents with   Annual Exam    This visit occurred during the SARS-CoV-2 public health emergency.  Safety protocols were in place, including screening questions prior to the visit, additional usage of staff PPE, and extensive cleaning of exam room while observing appropriate contact time as indicated for disinfecting solutions.   Subjective:   Joyce Rowe is a 56 y.o. very pleasant female patient with Body mass index is 40.57 kg/m. who presents with the following:  F/u biometrics from [facility]  Diabetes Mellitus: Tolerating Medications: yes Compliance with diet: fair, Body mass index is 40.57 kg/m. Exercise: minimal / intermittent Avg blood sugars at home: not checking Foot problems: none Hypoglycemia: none No nausea, vomitting, blurred vision, polyuria.  Carbs almost everyday   Lab Results  Component Value Date   HGBA1C 8.3 (H) 03/10/2021   HGBA1C 7.7 (H) 03/13/2020   HGBA1C 7.2 (H) 08/09/2019   Lab Results  Component Value Date   LDLCALC 35 03/10/2021   CREATININE 0.88 03/10/2021    Wt Readings from Last 3 Encounters:  03/19/21 259 lb (117.5 kg)  12/06/20 258 lb (117 kg)  03/18/20 256 lb (116.1 kg)    Now on Metformin 500 mg TID  Shingrix today  Review of Systems is noted in the HPI, as appropriate  Objective:   BP 107/82   Pulse (!) 103   Temp (!) 97.2 F (36.2 C) (Temporal)   Ht 5' 7" (1.702 m)   Wt 259 lb (117.5 kg)   LMP 11/28/2020   SpO2 98%   BMI 40.57 kg/m   GEN: No acute distress; alert,appropriate. PULM: Breathing comfortably in no respiratory distress PSYCH: Normally interactive.  CV:  RRR, no m/g/r   Laboratory and Imaging Data: Results for orders placed or performed in visit on 03/10/21  CBC with Differential/Platelet  Result Value Ref Range   WBC 8.1 3.4 - 10.8 x10E3/uL   RBC 5.23 3.77 - 5.28 x10E6/uL   Hemoglobin 14.6 11.1 - 15.9 g/dL   Hematocrit 44.9 34.0 - 46.6 %   MCV 86 79 - 97 fL   MCH 27.9 26.6 - 33.0 pg   MCHC 32.5 31.5 - 35.7 g/dL   RDW 13.9 11.7 - 15.4 %   Platelets 287 150 - 450 x10E3/uL   Neutrophils 43 Not Estab. %   Lymphs 39 Not Estab. %   Monocytes 10 Not Estab. %   Eos 7 Not Estab. %   Basos 1 Not Estab. %   Neutrophils Absolute 3.6 1.4 - 7.0 x10E3/uL   Lymphocytes Absolute 3.1 0.7 - 3.1 x10E3/uL   Monocytes Absolute 0.8 0.1 - 0.9 x10E3/uL   EOS (ABSOLUTE) 0.6 (H) 0.0 - 0.4 x10E3/uL   Basophils Absolute 0.1 0.0 - 0.2 x10E3/uL   Immature Granulocytes 0 Not Estab. %   Immature Grans (Abs) 0.0 0.0 - 0.1 B26O0/BT  Basic metabolic panel  Result Value Ref Range   Glucose 231 (H) 65 - 99 mg/dL   BUN 17 6 - 24 mg/dL   Creatinine, Ser 0.88 0.57 - 1.00 mg/dL   eGFR 79 >59 mL/min/1.73   BUN/Creatinine Ratio 19 9 - 23  Sodium 138 134 - 144 mmol/L   Potassium 4.7 3.5 - 5.2 mmol/L   Chloride 95 (L) 96 - 106 mmol/L   CO2 25 20 - 29 mmol/L   Calcium 9.9 8.7 - 10.2 mg/dL  Lipid Panel w/o Chol/HDL Ratio  Result Value Ref Range   Cholesterol, Total 115 100 - 199 mg/dL   Triglycerides 250 (H) 0 - 149 mg/dL   HDL 42 >39 mg/dL   VLDL Cholesterol Cal 38 5 - 40 mg/dL   LDL Chol Calc (NIH) 35 0 - 99 mg/dL  Hepatic function panel  Result Value Ref Range   Total Protein 6.6 6.0 - 8.5 g/dL   Albumin 4.5 3.8 - 4.9 g/dL   Bilirubin Total 0.7 0.0 - 1.2 mg/dL   Bilirubin, Direct 0.21 0.00 - 0.40 mg/dL   Alkaline Phosphatase 69 44 - 121 IU/L   AST 33 0 - 40 IU/L   ALT 34 (H) 0 - 32 IU/L  Microalbumin / creatinine urine ratio  Result Value Ref Range   Creatinine, Urine 258.1 Not Estab. mg/dL   Microalbumin, Urine 82.8 Not Estab. ug/mL   Microalb/Creat  Ratio 32 (H) 0 - 29 mg/g creat  SARS-CoV-2 Semi-Quantitative Total Antibody, Spike  Result Value Ref Range   SARS-CoV-2 Semi-Quant Total Ab See Dilution Negative<0.8 U/mL   SARS-CoV-2 Spike Ab Interp Positive   Hgb A1c w/o eAG  Result Value Ref Range   Hgb A1c MFr Bld 8.3 (H) 4.8 - 5.6 %  Specimen status report  Result Value Ref Range   specimen status report Comment   SARS-CoV-2 Spike Ab Dilution  Result Value Ref Range   SARS-CoV-2 Spike Ab Dilution 3,819 Negative<0.8 U/mL     Assessment and Plan:     ICD-10-CM   1. Controlled type 2 diabetes mellitus without complication, without long-term current use of insulin (HCC)  E11.9 glucose blood (CONTOUR NEXT TEST) test strip    2. Colon cancer screening  Z12.11 Cologuard    3. Need for shingles vaccine  Z23 Varicella-zoster vaccine IM (Shingrix)     A1c is currently greater than 8.  She has not been following a diabetic diet all that well, and she is going to attempt to tighten this and recheck an A1c in 3 months.  I think that this is entirely reasonable, and I would do want her to come in face-to-face in 6 months.  Meds ordered this encounter  Medications   glucose blood (CONTOUR NEXT TEST) test strip    Sig: Use to check blood sugar once daily    Dispense:  100 each    Refill:  3   NONFORMULARY OR COMPOUNDED ITEM    Sig: Epic Account: [id number] A1c, E11.9 diabetes mellitus type 2 Vit D: E55.9 low vitamin d levels    Dispense:  1 each    Refill:  0   Medications Discontinued During This Encounter  Medication Reason   NONFORMULARY OR COMPOUNDED ITEM    Orders Placed This Encounter  Procedures   Varicella-zoster vaccine IM (Shingrix)   Cologuard    Follow-up: Return in about 6 months (around 09/19/2021) for diabetes .  Dr[employer] One speech-to-text software was used for transcription in this dictation.  Possible transcriptional errors can occur using Editor, commissioning.   Signed,  [name]. Copland,  MD   Outpatient Encounter Medications as of 03/19/2021  Medication Sig   glucose blood (CONTOUR NEXT TEST) test strip Use to check blood sugar once daily   hydrochlorothiazide (HYDRODIURIL)  25 MG tablet TAKE 1 TABLET BY MOUTH  DAILY   lisinopril (ZESTRIL) 40 MG tablet TAKE 1 TABLET BY MOUTH  DAILY   metFORMIN (GLUCOPHAGE-XR) 500 MG 24 hr tablet TAKE 3 TABLETS BY MOUTH  DAILY WITH BREAKFAST   rosuvastatin (CRESTOR) 40 MG tablet TAKE 1 TABLET BY MOUTH  DAILY   NONFORMULARY OR COMPOUNDED ITEM Epic Account: [id number] A1c, E11.9 diabetes mellitus type 2 Vit D: E55.9 low vitamin d levels   [DISCONTINUED] NONFORMULARY OR COMPOUNDED ITEM Epic Account: [id number] Lab Studies: BMP, CBC with diff, HFP: Z79.899 [id number] - SARS-CoV-2 Semi-Quant Total Ab: z78.9 FLP, E78.5 hyperlipidemia A1c, E11.9 diabetes mellitus type 2 Urine microalbumin, E11.9 diabetes mellitus type 2   No facility-administered encounter medications on file as of 03/19/2021.

## 2021-04-01 ENCOUNTER — Other Ambulatory Visit: Payer: Self-pay | Admitting: Family Medicine

## 2021-04-01 DIAGNOSIS — Z1231 Encounter for screening mammogram for malignant neoplasm of breast: Secondary | ICD-10-CM

## 2021-04-12 LAB — COLOGUARD

## 2021-04-25 ENCOUNTER — Other Ambulatory Visit: Payer: Self-pay | Admitting: Family Medicine

## 2021-04-25 DIAGNOSIS — I10 Essential (primary) hypertension: Secondary | ICD-10-CM

## 2021-05-08 ENCOUNTER — Ambulatory Visit
Admission: RE | Admit: 2021-05-08 | Discharge: 2021-05-08 | Disposition: A | Discharge status: Home / Self Care | Payer: Managed Care, Other (non HMO) | Source: Ambulatory Visit | Attending: Family Medicine | Admitting: Family Medicine

## 2021-05-08 ENCOUNTER — Other Ambulatory Visit: Payer: Self-pay

## 2021-05-08 DIAGNOSIS — Z1231 Encounter for screening mammogram for malignant neoplasm of breast: Secondary | ICD-10-CM | POA: Diagnosis present

## 2021-05-29 ENCOUNTER — Telehealth: Payer: Self-pay | Admitting: Family Medicine

## 2021-05-29 NOTE — Telephone Encounter (Addendum)
I do not see that any forms have been received from [employer].  I tried calling the number x 3 and got a message that there are no available agents in the queue and it hangs up.  If they call back please have them refax form to [employer] at [phone].

## 2021-05-29 NOTE — Telephone Encounter (Signed)
Liesel from [place] on behalf of [employer] called and wanted to follow up with a form that was faxed 05/22/21 and wanted to see where we were with filling it out. Call back [phone] ext [phone]

## 2021-06-11 LAB — COLOGUARD: COLOGUARD: NEGATIVE

## 2021-07-02 ENCOUNTER — Other Ambulatory Visit: Payer: Self-pay | Admitting: Family Medicine

## 2021-07-02 DIAGNOSIS — E119 Type 2 diabetes mellitus without complications: Secondary | ICD-10-CM

## 2021-07-02 DIAGNOSIS — I10 Essential (primary) hypertension: Secondary | ICD-10-CM

## 2021-07-02 DIAGNOSIS — E7800 Pure hypercholesterolemia, unspecified: Secondary | ICD-10-CM

## 2021-08-24 ENCOUNTER — Encounter: Payer: Self-pay | Admitting: Family Medicine

## 2021-09-10 ENCOUNTER — Ambulatory Visit: Payer: Managed Care, Other (non HMO)

## 2022-01-25 ENCOUNTER — Other Ambulatory Visit: Payer: Self-pay

## 2022-01-25 ENCOUNTER — Encounter: Payer: Self-pay | Admitting: *Deleted

## 2022-01-25 ENCOUNTER — Ambulatory Visit
Admission: EM | Admit: 2022-01-25 | Discharge: 2022-01-25 | Disposition: A | Discharge status: Home / Self Care | Payer: Managed Care, Other (non HMO) | Attending: Emergency Medicine | Admitting: Emergency Medicine

## 2022-01-25 DIAGNOSIS — M5441 Lumbago with sciatica, right side: Secondary | ICD-10-CM | POA: Diagnosis not present

## 2022-01-25 MED ORDER — CYCLOBENZAPRINE HCL 10 MG PO TABS: 10.0000 mg | ORAL_TABLET | Freq: Two times a day (BID) | ORAL | 0 refills | Status: DC | PRN

## 2022-01-25 NOTE — Discharge Instructions (Addendum)
Take ibuprofen as needed for discomfort.  Take the muscle relaxer as needed for muscle spasm; Do not drive, operate machinery, or drink alcohol with this medication as it can cause drowsiness.   Follow up with your primary care provider or an orthopedist if your symptoms are not improving.     

## 2022-01-25 NOTE — ED Provider Notes (Signed)
Joyce Rowe    CSN: [id number] Arrival date & time: 01/25/22  [id number]      History   Chief Complaint Chief Complaint  Patient presents with   Back Pain    Entered by patient    HPI Joyce Rowe is a 56 y.o. female.  Patient presents with right lower back pain which radiates to her right thigh x 1 week; worse x 1 day.  No falls or injury.  She reports history of sciatica but it is usually on her left side.  She denies numbness, weakness, saddle anesthesia, loss of bowel/bladder control, fever, chills, abdominal pain, nausea, vomiting, diarrhea, constipation, dysuria, hematuria, vaginal discharge, pelvic pain, or other symptoms.  Treatment at home with ibuprofen; last taken this morning.  Her medical history includes diabetes, hypertension, kidney stone, morbid obesity.  The history is provided by the patient and medical records.    Past Medical History:  Diagnosis Date   Diabetes mellitus type 2, uncontrolled, without complications [id number]   Hyperlipidemia    Kidney stone    Microscopic hematuria    Morbid obesity with BMI of 40.0-44.9, adult ([place]) 03/28/2014   Unspecified essential hypertension     Patient Active Problem List   Diagnosis Date Noted   Kidney stones 12/28/2015   Cysts of both ovaries 12/28/2015   Essential hypertension 08/21/2015   Controlled type 2 diabetes mellitus without complication, without long-term current use of insulin ([place]) 04/03/2015   Hyperlipidemia LDL goal <70 03/28/2014   Morbid obesity with BMI of 40.0-44.9, adult ([place]) 03/28/2014    Past Surgical History:  Procedure Laterality Date   CESAREAN SECTION  08/2002   EXTRACORPOREAL SHOCK WAVE LITHOTRIPSY Left 12/12/2015   Procedure: EXTRACORPOREAL SHOCK WAVE LITHOTRIPSY (ESWL);  Surgeon: Hollice Espy, MD;  Location: [facility];  Service: Urology;  Laterality: Left;   LAPAROSCOPIC BILATERAL SALPINGECTOMY Bilateral 02/27/2016   Procedure: LAPAROSCOPIC BILATERAL SALPINGECTOMY;  Surgeon:  Will Bonnet, MD;  Location: [facility];  Service: Gynecology;  Laterality: Bilateral;   LAPAROSCOPIC OVARIAN CYSTECTOMY Right 02/27/2016   Procedure: LAPAROSCOPIC OVARIAN CYSTECTOMY;  Surgeon: Will Bonnet, MD;  Location: [facility];  Service: Gynecology;  Laterality: Right;   tubes in ears     70's    OB History     Gravida  2   Para  1   Term  1   Preterm      AB  1   Living  1      SAB  1   IAB      Ectopic      Multiple      Live Births  1            Home Medications    Prior to Admission medications   Medication Sig Start Date End Date Taking? Authorizing Provider  cyclobenzaprine (FLEXERIL) 10 MG tablet Take 1 tablet (10 mg total) by mouth 2 (two) times daily as needed for muscle spasms. 01/25/22  Yes Sharion Balloon, NP  glucose blood (CONTOUR NEXT TEST) test strip Use to check blood sugar once daily 03/19/21   Copland, Frederico Hamman, MD  hydrochlorothiazide (HYDRODIURIL) 25 MG tablet TAKE 1 TABLET BY MOUTH  DAILY 04/25/21   Copland, Frederico Hamman, MD  lisinopril (ZESTRIL) 40 MG tablet TAKE 1 TABLET BY MOUTH  DAILY 07/02/21   Copland, Frederico Hamman, MD  metFORMIN (GLUCOPHAGE-XR) 500 MG 24 hr tablet TAKE 3 TABLETS BY MOUTH  DAILY WITH BREAKFAST 07/02/21   Owens Loffler, MD  NONFORMULARY OR COMPOUNDED  ITEM Epic Account: [id number] A1c, E11.9 diabetes mellitus type 2 Vit D: E55.9 low vitamin d levels 03/19/21   Copland, Spencer, MD  rosuvastatin (CRESTOR) 40 MG tablet TAKE 1 TABLET BY MOUTH  DAILY 07/02/21   Copland, Karleen Hampshire, MD    Family History Family History  Problem Relation Age of Onset   Hypertension Mother    Breast cancer Mother 69   Hypertension Father    Hematuria Father    Colon cancer Other        Grandparents  <60   Heart disease Other        Grandparents   Diabetes Maternal Grandmother    Kidney disease Neg Hx    Bladder Cancer Neg Hx    Kidney cancer Neg Hx     Social History Social History   Tobacco Use   Smoking status: Never   Smokeless  tobacco: Never  Vaping Use   Vaping Use: Never used  Substance Use Topics   Alcohol use: No   Drug use: No     Allergies   Naproxen, Bextra [valdecoxib], and Metaxalone   Review of Systems Review of Systems  Constitutional:  Negative for chills and fever.  Gastrointestinal:  Negative for abdominal pain, blood in stool, constipation, diarrhea, nausea and vomiting.  Genitourinary:  Negative for dysuria, flank pain, frequency, hematuria, pelvic pain and vaginal discharge.  Musculoskeletal:  Positive for back pain. Negative for arthralgias, gait problem and joint swelling.  Skin:  Negative for color change, rash and wound.  Neurological:  Negative for weakness and numbness.  All other systems reviewed and are negative.    Physical Exam Triage Vital Signs ED Triage Vitals  Enc Vitals Group     BP      Pulse      Resp      Temp      Temp src      SpO2      Weight      Height      Head Circumference      Peak Flow      Pain Score      Pain Loc      Pain Edu?      Excl. in GC?    No data found.  Updated Vital Signs BP (!) 154/89   Pulse (!) 105   Temp 97.6 F (36.4 C)   Resp 20   SpO2 98%   Visual Acuity Right Eye Distance:   Left Eye Distance:   Bilateral Distance:    Right Eye Near:   Left Eye Near:    Bilateral Near:     Physical Exam Vitals and nursing note reviewed.  Constitutional:      General: She is not in acute distress.    Appearance: She is well-developed. She is obese. She is not ill-appearing.  HENT:     Mouth/Throat:     Mouth: Mucous membranes are moist.  Cardiovascular:     Rate and Rhythm: Normal rate and regular rhythm.  Pulmonary:     Effort: Pulmonary effort is normal. No respiratory distress.  Abdominal:     General: Bowel sounds are normal.     Palpations: Abdomen is soft.     Tenderness: There is no abdominal tenderness. There is no right CVA tenderness, left CVA tenderness, guarding or rebound.  Musculoskeletal:         General: No swelling, tenderness, deformity or signs of injury. Normal range of motion.     Cervical back: Neck  supple.       Back:  Skin:    General: Skin is warm and dry.     Capillary Refill: Capillary refill takes less than 2 seconds.     Findings: No bruising, erythema, lesion or rash.  Neurological:     General: No focal deficit present.     Mental Status: She is alert and oriented to person, place, and time.     Sensory: No sensory deficit.     Motor: No weakness.     Gait: Gait normal.  Psychiatric:        Mood and Affect: Mood normal.        Behavior: Behavior normal.      UC Treatments / Results  Labs (all labs ordered are listed, but only abnormal results are displayed) Labs Reviewed - No data to display  EKG   Radiology No results found.  Procedures Procedures (including critical care time)  Medications Ordered in UC Medications - No data to display  Initial Impression / Assessment and Plan / UC Course  I have reviewed the triage vital signs and the nursing notes.  Pertinent labs & imaging results that were available during my care of the patient were reviewed by me and considered in my medical decision making (see chart for details).    Right lower back pain with right-sided sciatica.  Instructed patient to continue ibuprofen as needed.  Also treating with Flexeril; precautions for drowsiness with this medication discussed.  Education provided on sciatica.  Instructed patient to follow-up with her PCP or an orthopedist if her symptoms are not improving.  She agrees to plan of care.    Final Clinical Impressions(s) / UC Diagnoses   Final diagnoses:  Acute right-sided low back pain with right-sided sciatica     Discharge Instructions      Take ibuprofen as needed for discomfort.  Take the muscle relaxer as needed for muscle spasm; Do not drive, operate machinery, or drink alcohol with this medication as it can cause drowsiness.   Follow up with  your primary care provider or an orthopedist if your symptoms are not improving.         ED Prescriptions     Medication Sig Dispense Auth. Provider   cyclobenzaprine (FLEXERIL) 10 MG tablet Take 1 tablet (10 mg total) by mouth 2 (two) times daily as needed for muscle spasms. 20 tablet Mickie Bail, NP      I have reviewed the PDMP during this encounter.   Mickie Bail, NP 01/25/22 1027

## 2022-01-25 NOTE — ED Triage Notes (Signed)
Pt reports back pain that radiates into Rt buttocks. Pt reports she has sciatic nerve pain.

## 2022-02-06 ENCOUNTER — Encounter: Payer: Self-pay | Admitting: Family Medicine

## 2022-03-04 MED ORDER — NONFORMULARY OR COMPOUNDED ITEM: 0 refills | Status: DC

## 2022-03-10 ENCOUNTER — Other Ambulatory Visit: Payer: Self-pay | Admitting: Family Medicine

## 2022-03-10 DIAGNOSIS — E119 Type 2 diabetes mellitus without complications: Secondary | ICD-10-CM

## 2022-03-16 ENCOUNTER — Other Ambulatory Visit: Payer: Self-pay | Admitting: Family Medicine

## 2022-03-17 LAB — CBC WITH DIFFERENTIAL/PLATELET
Basophils Absolute: 0.1 10*3/uL (ref 0.0–0.2)
Basos: 1 %
EOS (ABSOLUTE): 0.3 10*3/uL (ref 0.0–0.4)
Eos: 5 %
Hematocrit: 44 % (ref 34.0–46.6)
Hemoglobin: 14.5 g/dL (ref 11.1–15.9)
Immature Grans (Abs): 0 10*3/uL (ref 0.0–0.1)
Immature Granulocytes: 0 %
Lymphocytes Absolute: 3.3 10*3/uL — ABNORMAL HIGH (ref 0.7–3.1)
Lymphs: 49 %
MCH: 28.1 pg (ref 26.6–33.0)
MCHC: 33 g/dL (ref 31.5–35.7)
MCV: 85 fL (ref 79–97)
Monocytes Absolute: 0.6 10*3/uL (ref 0.1–0.9)
Monocytes: 9 %
Neutrophils Absolute: 2.4 10*3/uL (ref 1.4–7.0)
Neutrophils: 36 %
Platelets: 263 10*3/uL (ref 150–450)
RBC: 5.16 x10E6/uL (ref 3.77–5.28)
RDW: 13.6 % (ref 11.7–15.4)
WBC: 6.6 10*3/uL (ref 3.4–10.8)

## 2022-03-17 LAB — LIPID PANEL W/O CHOL/HDL RATIO
Cholesterol, Total: 124 mg/dL (ref 100–199)
HDL: 42 mg/dL (ref >39)
LDL Chol Calc (NIH): 32 mg/dL (ref 0–99)
Triglycerides: 341 mg/dL — ABNORMAL HIGH (ref 0–149)
VLDL Cholesterol Cal: 50 mg/dL — ABNORMAL HIGH (ref 5–40)

## 2022-03-17 LAB — HEPATIC FUNCTION PANEL (6)
ALT: 32 IU/L (ref 0–32)
AST: 28 IU/L (ref 0–40)
Albumin: 4.3 g/dL (ref 3.8–4.9)
Alkaline Phosphatase: 75 IU/L (ref 44–121)
Bilirubin Total: 0.6 mg/dL (ref 0.0–1.2)
Bilirubin, Direct: 0.19 mg/dL (ref 0.00–0.40)

## 2022-03-17 LAB — BASIC METABOLIC PANEL (7)
BUN/Creatinine Ratio: 25 — ABNORMAL HIGH (ref 9–23)
BUN: 25 mg/dL — ABNORMAL HIGH (ref 6–24)
CO2: 23 mmol/L (ref 20–29)
Chloride: 96 mmol/L (ref 96–106)
Creatinine, Ser: 0.99 mg/dL (ref 0.57–1.00)
Glucose: 248 mg/dL — ABNORMAL HIGH (ref 70–99)
Potassium: 4.7 mmol/L (ref 3.5–5.2)
Sodium: 139 mmol/L (ref 134–144)
eGFR: 68 mL/min/{1.73_m2} (ref >59)

## 2022-03-17 LAB — TSH: TSH: 1.88 u[IU]/mL (ref 0.450–4.500)

## 2022-03-17 LAB — HGB A1C W/O EAG: Hgb A1c MFr Bld: 9.6 % — ABNORMAL HIGH (ref 4.8–5.6)

## 2022-03-23 ENCOUNTER — Encounter: Payer: Self-pay | Admitting: Family Medicine

## 2022-03-23 ENCOUNTER — Ambulatory Visit (INDEPENDENT_AMBULATORY_CARE_PROVIDER_SITE_OTHER): Payer: Managed Care, Other (non HMO) | Admitting: Family Medicine

## 2022-03-23 VITALS — BP 138/78 | HR 100 | Temp 98.3°F | Ht 67.0 in | Wt 249.2 lb

## 2022-03-23 DIAGNOSIS — Z0000 Encounter for general adult medical examination without abnormal findings: Secondary | ICD-10-CM | POA: Diagnosis not present

## 2022-03-23 DIAGNOSIS — E119 Type 2 diabetes mellitus without complications: Secondary | ICD-10-CM

## 2022-03-23 DIAGNOSIS — I10 Essential (primary) hypertension: Secondary | ICD-10-CM

## 2022-03-23 MED ORDER — NONFORMULARY OR COMPOUNDED ITEM: 0 refills | Status: DC

## 2022-03-23 MED ORDER — HYDROCHLOROTHIAZIDE 12.5 MG PO TABS: 12.5000 mg | ORAL_TABLET | Freq: Every day | ORAL | 3 refills | Status: DC

## 2022-03-23 MED ORDER — OZEMPIC (0.25 OR 0.5 MG/DOSE) 2 MG/3ML ~~LOC~~ SOPN: 0.2500 mg | PEN_INJECTOR | SUBCUTANEOUS | 3 refills | Status: DC

## 2022-03-23 MED ORDER — METFORMIN HCL ER 500 MG PO TB24: 2000.0000 mg | ORAL_TABLET | Freq: Every day | ORAL | 3 refills | Status: DC

## 2022-03-23 NOTE — Progress Notes (Signed)
Joyce Rowe T. Akyra Bouchie, MD, [facility] [place], [address]  Phone: [phone]  FAX: [phone]  Joyce Rowe - 56 y.o. female  MRN [id number]  Date of Birth: March 19, 1966  Date: 03/23/2022  PCP: Owens Loffler, MD  Referral: Owens Loffler, MD  Chief Complaint  Patient presents with   Annual Exam   Patient Care Team: Owens Loffler, MD as PCP - General Subjective:   Joyce Rowe is a 56 y.o. pleasant patient who presents with the following:  Health Maintenance Summary Reviewed and updated, unless pt declines services.  Tobacco History Reviewed. Non-smoker Alcohol: No concerns, no excessive use Exercise Habits: walking and swimming daily, weight work, dog walk, too. STD concerns: none Drug Use: None Lumps or breast concerns: no  Diabetes Mellitus: Tolerating Medications: yes Compliance with diet: fair, Body mass index is 39.04 kg/m. Exercise: minimal / intermittent Avg blood sugars at home: 200+ Foot problems: none Hypoglycemia: none No nausea, vomitting, blurred vision, polyuria.  Lab Results  Component Value Date   HGBA1C 9.6 (H) 03/16/2022   HGBA1C 8.3 (H) 03/10/2021   HGBA1C 7.7 (H) 03/13/2020   Lab Results  Component Value Date   LDLCALC 32 03/16/2022   CREATININE 0.99 03/16/2022    Wt Readings from Last 3 Encounters:  03/23/22 249 lb 4 oz (113.1 kg)  03/19/21 259 lb (117.5 kg)  12/06/20 258 lb (117 kg)    Bivalent covid  Health Maintenance  Topic Date Due   HIV Screening  Never done   Hepatitis C Screening  Never done   FOOT EXAM  08/13/2020   OPHTHALMOLOGY EXAM  10/02/2020   COVID-19 Vaccine (5 - Pfizer risk series) 08/08/2021   INFLUENZA VACCINE  03/10/2022   MAMMOGRAM  05/08/2022   HEMOGLOBIN A1C  09/16/2022   TETANUS/TDAP  03/28/2024   Fecal DNA (Cologuard)  06/03/2024   PAP SMEAR-Modifier  12/06/2025   Zoster Vaccines- Shingrix  Completed   HPV VACCINES   Aged Out    Immunization History  Administered Date(s) Administered   Influenza,inj,Quad PF,6+ Mos 05/13/2015, 05/15/2019   Influenza-Unspecified 06/13/2021   PFIZER(Purple Top)SARS-COV-2 Vaccination 10/26/2019, 11/16/2019, 06/04/2020, 06/13/2021   Pneumococcal Polysaccharide-23 05/13/2015   Tdap 03/28/2014   Zoster Recombinat (Shingrix) 03/19/2021, 08/24/2021   Patient Active Problem List   Diagnosis Date Noted   Kidney stones 12/28/2015   Cysts of both ovaries 12/28/2015   Essential hypertension 08/21/2015   Controlled type 2 diabetes mellitus without complication, without long-term current use of insulin ([place]) 04/03/2015   Hyperlipidemia LDL goal <70 03/28/2014   Morbid obesity with BMI of 40.0-44.9, adult ([place]) 03/28/2014    Past Medical History:  Diagnosis Date   Diabetes mellitus type 2, uncontrolled, without complications 8/78/6767   Hyperlipidemia    Kidney stone    Microscopic hematuria    Morbid obesity with BMI of 40.0-44.9, adult ([place]) 03/28/2014   Unspecified essential hypertension     Past Surgical History:  Procedure Laterality Date   CESAREAN SECTION  08/2002   EXTRACORPOREAL SHOCK WAVE LITHOTRIPSY Left 12/12/2015   Procedure: EXTRACORPOREAL SHOCK WAVE LITHOTRIPSY (ESWL);  Surgeon: Hollice Espy, MD;  Location: [facility];  Service: Urology;  Laterality: Left;   LAPAROSCOPIC BILATERAL SALPINGECTOMY Bilateral 02/27/2016   Procedure: LAPAROSCOPIC BILATERAL SALPINGECTOMY;  Surgeon: Will Bonnet, MD;  Location: [facility];  Service: Gynecology;  Laterality: Bilateral;   LAPAROSCOPIC OVARIAN CYSTECTOMY Right 02/27/2016   Procedure: LAPAROSCOPIC OVARIAN CYSTECTOMY;  Surgeon: Estill Bamberg  Glennon Mac, MD;  Location: [facility];  Service: Gynecology;  Laterality: Right;   tubes in ears     12's    Family History  Problem Relation Age of Onset   Hypertension Mother    Breast cancer Mother 27   Hypertension Father    Hematuria Father    Colon cancer Other         Grandparents  <60   Heart disease Other        Grandparents   Diabetes Maternal Grandmother    Kidney disease Neg Hx    Bladder Cancer Neg Hx    Kidney cancer Neg Hx     Social History   Social History Narrative   Married      1 child      BS, SAHM      6-8 hours sleep per night      3 people living in home      Regular exercise-yes    Past Medical History, Surgical History, Social History, Family History, Problem List, Medications, and Allergies have been reviewed and updated if relevant.  Review of Systems: Pertinent positives are listed above.  Otherwise, a full 14 point review of systems has been done in full and it is negative except where it is noted positive.  Objective:   BP 138/78 (BP Location: Left Arm, Patient Position: Sitting)   Pulse 100   Temp 98.3 F (36.8 C) (Oral)   Ht _0  (1.702 m)   Wt 249 lb 4 oz (113.1 kg)   SpO2 98%   BMI 39.04 kg/m  Ideal Body Weight: Weight in (lb) to have BMI = 25: 159.3 No results found.    03/23/2022    8:12 AM 12/06/2020    8:42 AM 05/15/2019    2:19 PM 02/23/2018    8:08 AM 02/08/2017    8:15 AM  Depression screen PHQ 2/9  Decreased Interest 0 0 0 0 0  Down, Depressed, Hopeless 0 0 0 0 0  PHQ - 2 Score 0 0 0 0 0  Altered sleeping  0     Tired, decreased energy  0     Change in appetite  0     Feeling bad or failure about yourself   0     Trouble concentrating  0     Moving slowly or fidgety/restless  0     Suicidal thoughts  0     PHQ-9 Score  0     Difficult doing work/chores  Not difficult at all        GEN: well developed, well nourished, no acute distress Eyes: conjunctiva and lids normal, PERRLA, EOMI ENT: TM clear, nares clear, oral exam WNL Neck: supple, no lymphadenopathy, no thyromegaly, no JVD Pulm: clear to auscultation and percussion, respiratory effort normal CV: regular rate and rhythm, S1-S2, no murmur, rub or gallop, no bruits Chest: no scars, masses, no lumps BREAST: breast exam  declined GI: soft, non-tender; no hepatosplenomegaly, masses; active bowel sounds all quadrants GU: GU exam declined Lymph: no cervical, axillary or inguinal adenopathy MSK: gait normal, muscle tone and strength WNL, no joint swelling, effusions, discoloration, crepitus  SKIN: clear, good turgor, color WNL, no rashes, lesions, or ulcerations Neuro: normal mental status, normal strength, sensation, and motion Psych: alert; oriented to person, place and time, normally interactive and not anxious or depressed in appearance.   All labs reviewed with patient. Results for orders placed or performed in visit on 03/16/22  CBC with Differential/Platelet  Result Value Ref Range   WBC 6.6 3.4 - 10.8 x10E3/uL   RBC 5.16 3.77 - 5.28 x10E6/uL   Hemoglobin 14.5 11.1 - 15.9 g/dL   Hematocrit 44.0 34.0 - 46.6 %   MCV 85 79 - 97 fL   MCH 28.1 26.6 - 33.0 pg   MCHC 33.0 31.5 - 35.7 g/dL   RDW 13.6 11.7 - 15.4 %   Platelets 263 150 - 450 x10E3/uL   Neutrophils 36 Not Estab. %   Lymphs 49 Not Estab. %   Monocytes 9 Not Estab. %   Eos 5 Not Estab. %   Basos 1 Not Estab. %   Neutrophils Absolute 2.4 1.4 - 7.0 x10E3/uL   Lymphocytes Absolute 3.3 (H) 0.7 - 3.1 x10E3/uL   Monocytes Absolute 0.6 0.1 - 0.9 x10E3/uL   EOS (ABSOLUTE) 0.3 0.0 - 0.4 x10E3/uL   Basophils Absolute 0.1 0.0 - 0.2 x10E3/uL   Immature Granulocytes 0 Not Estab. %   Immature Grans (Abs) 0.0 0.0 - 0.1 I62V0/JJ  Basic Metabolic Panel (7)  Result Value Ref Range   Glucose 248 (H) 70 - 99 mg/dL   BUN 25 (H) 6 - 24 mg/dL   Creatinine, Ser 0.99 0.57 - 1.00 mg/dL   eGFR 68 >59 mL/min/1.73   BUN/Creatinine Ratio 25 (H) 9 - 23   Sodium 139 134 - 144 mmol/L   Potassium 4.7 3.5 - 5.2 mmol/L   Chloride 96 96 - 106 mmol/L   CO2 23 20 - 29 mmol/L  Lipid Panel w/o Chol/HDL Ratio  Result Value Ref Range   Cholesterol, Total 124 100 - 199 mg/dL   Triglycerides 341 (H) 0 - 149 mg/dL   HDL 42 >39 mg/dL   VLDL Cholesterol Cal 50 (H) 5 - 40  mg/dL   LDL Chol Calc (NIH) 32 0 - 99 mg/dL  Hepatic Function Panel (6)  Result Value Ref Range   Albumin 4.3 3.8 - 4.9 g/dL   Bilirubin Total 0.6 0.0 - 1.2 mg/dL   Bilirubin, Direct 0.19 0.00 - 0.40 mg/dL   Alkaline Phosphatase 75 44 - 121 IU/L   AST 28 0 - 40 IU/L   ALT 32 0 - 32 IU/L  Hgb A1c w/o eAG  Result Value Ref Range   Hgb A1c MFr Bld 9.6 (H) 4.8 - 5.6 %  TSH  Result Value Ref Range   TSH 1.880 0.450 - 4.500 uIU/mL   No results found.  Assessment and Plan:     ICD-10-CM   1. Healthcare maintenance  Z00.00     2. Essential hypertension  I10     3. Diabetes mellitus without complication (HCC)  K09.3 metFORMIN (GLUCOPHAGE-XR) 500 MG 24 hr tablet     She has been doing a great job with her exercise patterns and she is lost 10 pounds.  She is frustrated by her slow weight loss.  I think the primary concern is her diabetes that is continued to worsen.  Add Ozempic, increase metformin to 4 tablets a day.  Blood pressure is also [place] sometimes too low and she is felt faint.  We will drop the hydrochlorothiazide down to 12.5 mg today.  Health Maintenance Exam: The patient's preventative maintenance and recommended screening tests for an annual wellness exam were reviewed in full today. Brought up to date unless services declined.  Counselled on the importance of diet, exercise, and its role in overall health and mortality. The patient's FH and SH was reviewed, including their home life, tobacco  status, and drug and alcohol status.  Follow-up in 1 year for physical exam or additional follow-up below.  Follow-up: Return in about 3 months (around 06/23/2022) for follow-up in the office with Dr. Lorelei Pont. Or follow-up in 1 year if not noted.  No future appointments.  Meds ordered this encounter  Medications   hydrochlorothiazide (HYDRODIURIL) 12.5 MG tablet    Sig: Take 1 tablet (12.5 mg total) by mouth daily.    Dispense:  90 tablet    Refill:  3    For refills -  hold for now   metFORMIN (GLUCOPHAGE-XR) 500 MG 24 hr tablet    Sig: Take 4 tablets (2,000 mg total) by mouth daily with breakfast.    Dispense:  360 tablet    Refill:  3    Requesting 1 year supply   Semaglutide,0.25 or 0.5MG/DOS, (OZEMPIC, 0.25 OR 0.5 MG/DOSE,) 2 MG/3ML SOPN    Sig: Inject 0.25 mg into the skin once a week.    Dispense:  3 mL    Refill:  3   NONFORMULARY OR COMPOUNDED ITEM    Sig: Epic Account: [id number] FLP, E78.5 hyperlipidemia A1c, E11.9 diabetes mellitus type 2  Vit D: E55.9 low vitamin d levels    Dispense:  1 each    Refill:  0   Medications Discontinued During This Encounter  Medication Reason   NONFORMULARY OR COMPOUNDED ITEM    NONFORMULARY OR COMPOUNDED ITEM    hydrochlorothiazide (HYDRODIURIL) 25 MG tablet    metFORMIN (GLUCOPHAGE-XR) 500 MG 24 hr tablet    No orders of the defined types were placed in this encounter.   Signed,  [name]. Artyom Stencel, MD   Allergies as of 03/23/2022       Reactions   Naproxen    REACTION: Rash   Bextra [valdecoxib] Rash   Metaxalone Rash   REACTION: Rash        Medication List        Accurate as of March 23, 2022  9:14 AM. If you have any questions, ask your nurse or doctor.          STOP taking these medications    NONFORMULARY OR COMPOUNDED ITEM Stopped by: Owens Loffler, MD       TAKE these medications    Contour Next Test test strip Generic drug: glucose blood Use to check blood sugar once daily   cyclobenzaprine 10 MG tablet Commonly known as: FLEXERIL Take 1 tablet (10 mg total) by mouth 2 (two) times daily as needed for muscle spasms.   hydrochlorothiazide 12.5 MG tablet Commonly known as: HYDRODIURIL Take 1 tablet (12.5 mg total) by mouth daily. What changed:  medication strength how much to take Changed by: Owens Loffler, MD   lisinopril 40 MG tablet Commonly known as: ZESTRIL TAKE 1 TABLET BY MOUTH  DAILY   metFORMIN 500 MG 24 hr tablet Commonly known as:  GLUCOPHAGE-XR Take 4 tablets (2,000 mg total) by mouth daily with breakfast. What changed: See the new instructions. Changed by: Owens Loffler, MD   multivitamin tablet Take 1 tablet by mouth daily.   NONFORMULARY OR COMPOUNDED ITEM Epic Account: [id number] FLP, E78.5 hyperlipidemia A1c, E11.9 diabetes mellitus type 2  Vit D: E55.9 low vitamin d levels What changed: additional instructions Changed by: Owens Loffler, MD   Ozempic (0.25 or 0.5 MG/DOSE) 2 MG/3ML Sopn Generic drug: Semaglutide(0.25 or 0.5MG/DOS) Inject 0.25 mg into the skin once a week. Started by: Owens Loffler, MD   rosuvastatin 40 MG tablet  Commonly known as: CRESTOR TAKE 1 TABLET BY MOUTH  DAILY

## 2022-03-23 NOTE — Patient Instructions (Signed)
For the first month, take 0.25 mg of Ozempic, then after 1 month increase to 0.5 mg each week  Cut your hydrochlorothiazide down to 12.5 mg  Increase Metformin to 4 tablets each day.

## 2022-03-26 ENCOUNTER — Other Ambulatory Visit: Payer: Self-pay | Admitting: Family Medicine

## 2022-03-26 DIAGNOSIS — E7800 Pure hypercholesterolemia, unspecified: Secondary | ICD-10-CM

## 2022-03-26 DIAGNOSIS — I10 Essential (primary) hypertension: Secondary | ICD-10-CM

## 2022-03-27 ENCOUNTER — Other Ambulatory Visit: Payer: Self-pay | Admitting: Obstetrics and Gynecology

## 2022-03-27 ENCOUNTER — Other Ambulatory Visit: Payer: Self-pay | Admitting: Family Medicine

## 2022-03-27 DIAGNOSIS — Z1231 Encounter for screening mammogram for malignant neoplasm of breast: Secondary | ICD-10-CM

## 2022-04-29 ENCOUNTER — Encounter: Payer: Self-pay | Admitting: Family Medicine

## 2022-04-29 LAB — HM DIABETES EYE EXAM

## 2022-05-15 ENCOUNTER — Ambulatory Visit
Admission: RE | Admit: 2022-05-15 | Discharge: 2022-05-15 | Disposition: A | Discharge status: Home / Self Care | Payer: Managed Care, Other (non HMO) | Source: Ambulatory Visit | Attending: Family Medicine | Admitting: Family Medicine

## 2022-05-15 DIAGNOSIS — Z1231 Encounter for screening mammogram for malignant neoplasm of breast: Secondary | ICD-10-CM | POA: Diagnosis present

## 2022-05-17 ENCOUNTER — Encounter: Payer: Self-pay | Admitting: Family Medicine

## 2022-05-18 ENCOUNTER — Telehealth: Payer: Self-pay | Admitting: *Deleted

## 2022-05-18 MED ORDER — OZEMPIC (0.25 OR 0.5 MG/DOSE) 2 MG/3ML ~~LOC~~ SOPN: 0.5000 mg | PEN_INJECTOR | SUBCUTANEOUS | 0 refills | Status: DC

## 2022-05-18 NOTE — Telephone Encounter (Signed)
[id number] approved effective 05/18/2022 through 05/19/2023.  [facility] notified of approval via fax.

## 2022-05-18 NOTE — Telephone Encounter (Signed)
Received fax from [facility] requesting PA for Ozempic 0.5 mg.  PA completed on CoverMyMeds and sent to [facility] for review.  Can take up to 72 hours for a decision.

## 2022-06-05 ENCOUNTER — Encounter: Payer: Self-pay | Admitting: Family Medicine

## 2022-06-11 ENCOUNTER — Other Ambulatory Visit: Payer: Self-pay | Admitting: Family Medicine

## 2022-06-11 ENCOUNTER — Ambulatory Visit: Payer: Managed Care, Other (non HMO) | Admitting: Family Medicine

## 2022-06-12 LAB — LIPID PANEL W/O CHOL/HDL RATIO
Cholesterol, Total: 111 mg/dL (ref 100–199)
HDL: 38 mg/dL — ABNORMAL LOW (ref >39)
LDL Chol Calc (NIH): 41 mg/dL (ref 0–99)
Triglycerides: 197 mg/dL — ABNORMAL HIGH (ref 0–149)
VLDL Cholesterol Cal: 32 mg/dL (ref 5–40)

## 2022-06-12 LAB — HGB A1C W/O EAG: Hgb A1c MFr Bld: 7.3 % — ABNORMAL HIGH (ref 4.8–5.6)

## 2022-06-12 LAB — VITAMIN D 25 HYDROXY (VIT D DEFICIENCY, FRACTURES): Vit D, 25-Hydroxy: 41.2 ng/mL (ref 30.0–100.0)

## 2022-06-15 ENCOUNTER — Ambulatory Visit: Payer: Managed Care, Other (non HMO) | Admitting: Family Medicine

## 2022-06-15 ENCOUNTER — Encounter: Payer: Self-pay | Admitting: Family Medicine

## 2022-06-15 VITALS — BP 144/70 | HR 104 | Temp 97.8°F | Ht 67.0 in | Wt 246.1 lb

## 2022-06-15 DIAGNOSIS — I10 Essential (primary) hypertension: Secondary | ICD-10-CM | POA: Diagnosis not present

## 2022-06-15 DIAGNOSIS — E785 Hyperlipidemia, unspecified: Secondary | ICD-10-CM

## 2022-06-15 DIAGNOSIS — E119 Type 2 diabetes mellitus without complications: Secondary | ICD-10-CM

## 2022-06-15 MED ORDER — CONTOUR NEXT TEST VI STRP: ORAL_STRIP | 3 refills | Status: DC

## 2022-06-15 MED ORDER — MICROLET LANCETS MISC: 3 refills | Status: AC

## 2022-06-15 MED ORDER — SEMAGLUTIDE (2 MG/DOSE) 8 MG/3ML ~~LOC~~ SOPN: 2.0000 mg | PEN_INJECTOR | SUBCUTANEOUS | 3 refills | Status: DC

## 2022-06-15 NOTE — Addendum Note (Signed)
Addended by: Carter Kitten on: 06/15/2022 09:29 AM   Modules accepted: Orders

## 2022-06-15 NOTE — Progress Notes (Signed)
Joyce Cech T. Joyce Baldo, MD, [place] at [facility] [name], [address]  Phone: [phone]  FAX: [name] - 56 y.o. female  MRN [id number]  Date of Birth: 11/04/1965  Date: 06/15/2022  PCP: Owens Loffler, MD  Referral: Owens Loffler, MD  Chief Complaint  Patient presents with   Diabetes   Subjective:   Joyce Rowe is a 56 y.o. very pleasant female patient with Body mass index is 38.55 kg/m. who presents with the following:  Pleasant patient she is here to follow-up about her diabetes and high blood pressure.  The last time she was here I added Ozempic, and we also increased her metformin dosing to 2000 mg.  Wt Readings from Last 3 Encounters:  06/15/22 246 lb 2 oz (111.6 kg)  03/23/22 249 lb 4 oz (113.1 kg)  03/19/21 259 lb (117.5 kg)    Swimming almost every day.  Diabetes Mellitus: Tolerating Medications: yes Compliance with diet: fair, Body mass index is 38.55 kg/m. Exercise: swimming and walking almost every day Avg blood sugars at home: not checking Foot problems: none Hypoglycemia: none No nausea, vomitting, blurred vision, polyuria.  Lab Results  Component Value Date   HGBA1C 7.3 (H) 06/11/2022   HGBA1C 9.6 (H) 03/16/2022   HGBA1C 8.3 (H) 03/10/2021   Lab Results  Component Value Date   LDLCALC 41 06/11/2022   CREATININE 0.99 03/16/2022    Wt Readings from Last 3 Encounters:  06/15/22 246 lb 2 oz (111.6 kg)  03/23/22 249 lb 4 oz (113.1 kg)  03/19/21 259 lb (117.5 kg)    HTN: Tolerating all medications without side effects Stable and at goal No CP, no sob. No HA.  BP Readings from Last 3 Encounters:  06/15/22 (!) 144/70  03/23/22 138/78  01/25/22 (!) [id number]    Basic Metabolic Panel:    Component Value Date/Time   NA 139 03/16/2022 0708   K 4.7 03/16/2022 0708   CL 96 03/16/2022 0708   CO2 23 03/16/2022 0708   BUN 25 (H) 03/16/2022 0708   CREATININE  0.99 03/16/2022 0708   GLUCOSE 248 (H) 03/16/2022 0708   CALCIUM 9.9 03/10/2021 0810     Review of Systems is noted in the HPI, as appropriate  Objective:   BP (!) 144/70   Pulse (!) 104   Temp 97.8 F (36.6 C) (Oral)   Ht 5\' 7"  (1.702 m)   Wt 246 lb 2 oz (111.6 kg)   SpO2 97%   BMI 38.55 kg/m   GEN: No acute distress; alert,appropriate. PULM: Breathing comfortably in no respiratory distress PSYCH: Normally interactive.  CV: RRR, no m/g/r   Laboratory and Imaging Data:  Assessment and Plan:     ICD-10-CM   1. Controlled type 2 diabetes mellitus without complication, without long-term current use of insulin (HCC)  E11.9 CANCELED: POCT glycosylated hemoglobin (Hb A1C)    2. Essential hypertension  I10     3. Hyperlipidemia LDL goal <70  E78.5      Diabetes have an, but they are still not at goal.  We are going to increase her Ozempic to 1 mg for 4 weeks, then increase Ozempic to 2 mg.  I suspect that this will also help with her weight loss.  Blood pressure is stable.  Triglycerides are improved, overall lipid panel is reasonable and appropriate.  Medication Management during today's office visit: Meds ordered this encounter  Medications  Semaglutide, 2 MG/DOSE, 8 MG/3ML SOPN    Sig: Inject 2 mg as directed once a week.    Dispense:  9 mL    Refill:  3   Medications Discontinued During This Encounter  Medication Reason   cyclobenzaprine (FLEXERIL) 10 MG tablet Completed Course   NONFORMULARY OR COMPOUNDED ITEM    Semaglutide,0.25 or 0.5MG /DOS, (OZEMPIC, 0.25 OR 0.5 MG/DOSE,) 2 MG/3ML SOPN     Orders placed today for conditions managed today: No orders of the defined types were placed in this encounter.   Disposition: Return in about 6 months (around 12/14/2022) for diabetes follow-up.  Dragon [employer] One speech-to-text software was used for transcription in this dictation.  Possible transcriptional errors can occur using Editor, commissioning.    Signed,  [name]. Lexani Corona, MD   Outpatient Encounter Medications as of 06/15/2022  Medication Sig   glucose blood (CONTOUR NEXT TEST) test strip Use to check blood sugar once daily   hydrochlorothiazide (HYDRODIURIL) 12.5 MG tablet Take 1 tablet (12.5 mg total) by mouth daily.   lisinopril (ZESTRIL) 40 MG tablet TAKE 1 TABLET BY MOUTH DAILY   metFORMIN (GLUCOPHAGE-XR) 500 MG 24 hr tablet Take 4 tablets (2,000 mg total) by mouth daily with breakfast.   Multiple Vitamin (MULTIVITAMIN) tablet Take 1 tablet by mouth daily.   rosuvastatin (CRESTOR) 40 MG tablet TAKE 1 TABLET BY MOUTH DAILY   Semaglutide, 2 MG/DOSE, 8 MG/3ML SOPN Inject 2 mg as directed once a week.   [DISCONTINUED] Semaglutide,0.25 or 0.5MG /DOS, (OZEMPIC, 0.25 OR 0.5 MG/DOSE,) 2 MG/3ML SOPN Inject 0.5 mg into the skin once a week.   [DISCONTINUED] cyclobenzaprine (FLEXERIL) 10 MG tablet Take 1 tablet (10 mg total) by mouth 2 (two) times daily as needed for muscle spasms.   [DISCONTINUED] NONFORMULARY OR COMPOUNDED ITEM Epic Account: [id number] FLP, E78.5 hyperlipidemia A1c, E11.9 diabetes mellitus type 2  Vit D: E55.9 low vitamin d levels   No facility-administered encounter medications on file as of 06/15/2022.

## 2023-02-05 ENCOUNTER — Encounter: Payer: Self-pay | Admitting: Family Medicine

## 2023-02-05 DIAGNOSIS — Z131 Encounter for screening for diabetes mellitus: Secondary | ICD-10-CM

## 2023-02-08 MED ORDER — NONFORMULARY OR COMPOUNDED ITEM: 0 refills | Status: AC

## 2023-02-08 NOTE — Telephone Encounter (Signed)
done

## 2023-03-03 ENCOUNTER — Other Ambulatory Visit: Payer: Self-pay | Admitting: Family Medicine

## 2023-03-04 LAB — CBC WITH DIFFERENTIAL/PLATELET
Basophils Absolute: 0.1 10*3/uL (ref 0.0–0.2)
EOS (ABSOLUTE): 1 10*3/uL — ABNORMAL HIGH (ref 0.0–0.4)
Eos: 13 %
Hemoglobin: 14.7 g/dL (ref 11.1–15.9)
Immature Granulocytes: 0 %
Lymphocytes Absolute: 2.8 10*3/uL (ref 0.7–3.1)
Lymphs: 39 %
MCH: 27.8 pg (ref 26.6–33.0)
MCHC: 32.5 g/dL (ref 31.5–35.7)
MCV: 86 fL (ref 79–97)
Monocytes Absolute: 0.7 10*3/uL (ref 0.1–0.9)
Neutrophils Absolute: 2.8 10*3/uL (ref 1.4–7.0)
Platelets: 272 10*3/uL (ref 150–450)
RDW: 13.7 % (ref 11.7–15.4)
WBC: 7.3 10*3/uL (ref 3.4–10.8)

## 2023-03-04 LAB — BASIC METABOLIC PANEL
BUN/Creatinine Ratio: 25 — ABNORMAL HIGH (ref 9–23)
BUN: 22 mg/dL (ref 6–24)
CO2: 27 mmol/L (ref 20–29)
Calcium: 10.8 mg/dL — ABNORMAL HIGH (ref 8.7–10.2)
Chloride: 95 mmol/L — ABNORMAL LOW (ref 96–106)
Creatinine, Ser: 0.89 mg/dL (ref 0.57–1.00)
Potassium: 5 mmol/L (ref 3.5–5.2)
Sodium: 139 mmol/L (ref 134–144)
eGFR: 77 mL/min/{1.73_m2} (ref >59)

## 2023-03-04 LAB — HEPATIC FUNCTION PANEL
Alkaline Phosphatase: 58 IU/L (ref 44–121)
Bilirubin Total: 1 mg/dL (ref 0.0–1.2)
Bilirubin, Direct: 0.23 mg/dL (ref 0.00–0.40)
Total Protein: 6.9 g/dL (ref 6.0–8.5)

## 2023-03-04 LAB — LIPID PANEL W/O CHOL/HDL RATIO
Cholesterol, Total: 124 mg/dL (ref 100–199)
HDL: 47 mg/dL (ref >39)
LDL Chol Calc (NIH): 45 mg/dL (ref 0–99)
Triglycerides: 198 mg/dL — ABNORMAL HIGH (ref 0–149)
VLDL Cholesterol Cal: 32 mg/dL (ref 5–40)

## 2023-03-04 LAB — HGB A1C W/O EAG: Hgb A1c MFr Bld: 6.2 % — ABNORMAL HIGH (ref 4.8–5.6)

## 2023-03-17 NOTE — Progress Notes (Signed)
[name] [name], MD, CAQ Sports Medicine [facility] at [facility] [address] [place], [address]  Phone: [phone]  FAX: [phone]  Joyce Rowe - 57 y.o. female  MRN [id number]  Date of Birth: January 28, 1966  Date: 03/18/2023  PCP: Hannah Beat, MD  Referral: Hannah Beat, MD  Chief Complaint  Patient presents with   Form Completion    Annual Biometrics Form   Patient Care Team: Hannah Beat, MD as PCP - General Subjective:   Joyce Rowe is a 57 y.o. pleasant patient who presents with the following:  Mammo upcoming - a couple of months  Diabetes Mellitus: Tolerating Medications: yes Compliance with diet: fair, Body mass index is 34.38 kg/m. Exercise: minimal / intermittent Avg blood sugars at home: not checking Foot problems: none Hypoglycemia: none No nausea, vomitting, blurred vision, polyuria.  She has lost 30 pounds!  Lab Results  Component Value Date   HGBA1C 6.2 (H) 03/03/2023   HGBA1C 7.3 (H) 06/11/2022   HGBA1C 9.6 (H) 03/16/2022   Lab Results  Component Value Date   LDLCALC 45 03/03/2023   CREATININE 0.89 03/03/2023    Wt Readings from Last 3 Encounters:  03/18/23 219 lb 8 oz (99.6 kg)  06/15/22 246 lb 2 oz (111.6 kg)  03/23/22 249 lb 4 oz (113.1 kg)    HTN: Tolerating all medications without side effects Stable and at goal No CP, no sob. No HA.  BP Readings from Last 3 Encounters:  03/18/23 (!) 142/70  06/15/22 (!) 144/70  03/23/22 138/78    Basic Metabolic Panel:    Component Value Date/Time   NA 139 03/03/2023 0814   K 5.0 03/03/2023 0814   CL 95 (L) 03/03/2023 0814   CO2 27 03/03/2023 0814   BUN 22 03/03/2023 0814   CREATININE 0.89 03/03/2023 0814   GLUCOSE 155 (H) 03/03/2023 0814   CALCIUM 10.8 (H) 03/03/2023 0814     Health Maintenance  Topic Date Due   HIV Screening  Never done   Hepatitis C Screening  Never done   Diabetic kidney evaluation - Urine ACR  03/10/2022    COVID-19 Vaccine (6 - 2023-24 season) 07/31/2022   INFLUENZA VACCINE  03/11/2023   OPHTHALMOLOGY EXAM  04/30/2023   MAMMOGRAM  05/16/2023   HEMOGLOBIN A1C  09/03/2023   Diabetic kidney evaluation - eGFR measurement  03/02/2024   FOOT EXAM  03/17/2024   DTaP/Tdap/Td (2 - Td or Tdap) 03/28/2024   Fecal DNA (Cologuard)  06/03/2024   PAP SMEAR-Modifier  12/06/2025   Zoster Vaccines- Shingrix  Completed   HPV VACCINES  Aged Out    Immunization History  Administered Date(s) Administered   Influenza,inj,Quad PF,6+ Mos 05/13/2015, 05/15/2019   Influenza-Unspecified 06/13/2021, 06/05/2022   PFIZER(Purple Top)SARS-COV-2 Vaccination 10/26/2019, 11/16/2019, 06/04/2020, 06/13/2021   Pfizer Covid-19 Vaccine Bivalent Booster 13yrs & up 06/05/2022   Pneumococcal Polysaccharide-23 05/13/2015   Tdap 03/28/2014   Zoster Recombinant(Shingrix) 03/19/2021, 08/24/2021   Patient Active Problem List   Diagnosis Date Noted   Controlled type 2 diabetes mellitus without complication, without long-term current use of insulin (HCC) 04/03/2015    Priority: High   Essential hypertension 08/21/2015    Priority: Medium    Hyperlipidemia LDL goal <70 03/28/2014    Priority: Medium    Kidney stones 12/28/2015   Cysts of both ovaries 12/28/2015    Past Medical History:  Diagnosis Date   Diabetes mellitus type 2, uncontrolled, without complications 04/03/2015   Hyperlipidemia  Hypertension    Kidney stone     Past Surgical History:  Procedure Laterality Date   CESAREAN SECTION  08/2002   EXTRACORPOREAL SHOCK WAVE LITHOTRIPSY Left 12/12/2015   Procedure: EXTRACORPOREAL SHOCK WAVE LITHOTRIPSY (ESWL);  Surgeon: Vanna Scotland, MD;  Location: [facility] ORS;  Service: Urology;  Laterality: Left;   LAPAROSCOPIC BILATERAL SALPINGECTOMY Bilateral 02/27/2016   Procedure: LAPAROSCOPIC BILATERAL SALPINGECTOMY;  Surgeon: Conard Novak, MD;  Location: [facility] ORS;  Service: Gynecology;  Laterality: Bilateral;    LAPAROSCOPIC OVARIAN CYSTECTOMY Right 02/27/2016   Procedure: LAPAROSCOPIC OVARIAN CYSTECTOMY;  Surgeon: Conard Novak, MD;  Location: [facility] ORS;  Service: Gynecology;  Laterality: Right;   tubes in ears     70's    Family History  Problem Relation Age of Onset   Hypertension Mother    Breast cancer Mother 63   Hypertension Father    Hematuria Father    Colon cancer Other        Grandparents  <60   Heart disease Other        Grandparents   Diabetes Maternal Grandmother    Kidney disease Neg Hx    Bladder Cancer Neg Hx    Kidney cancer Neg Hx     Social History   Social History Narrative   Married      1 child      BS, SAHM      6-8 hours sleep per night      3 people living in home      Regular exercise-yes    Past Medical History, Surgical History, Social History, Family History, Problem List, Medications, and Allergies have been reviewed and updated if relevant.  Review of Systems: Pertinent positives are listed above.  Otherwise, a full 14 point review of systems has been done in full and it is negative except where it is noted positive.  Objective:   BP (!) 142/70   Pulse (!) 106   Temp (!) 97.3 F (36.3 C) (Temporal)   Ht 5\' 7"  (1.702 m)   Wt 219 lb 8 oz (99.6 kg)   LMP 11/28/2020   SpO2 97%   BMI 34.38 kg/m  Ideal Body Weight: Weight in (lb) to have BMI = 25: 159.3 No results found.    03/23/2022    8:12 AM 12/06/2020    8:42 AM 05/15/2019    2:19 PM 02/23/2018    8:08 AM 02/08/2017    8:15 AM  Depression screen PHQ 2/9  Decreased Interest 0 0 0 0 0  Down, Depressed, Hopeless 0 0 0 0 0  PHQ - 2 Score 0 0 0 0 0  Altered sleeping  0     Tired, decreased energy  0     Change in appetite  0     Feeling bad or failure about yourself   0     Trouble concentrating  0     Moving slowly or fidgety/restless  0     Suicidal thoughts  0     PHQ-9 Score  0     Difficult doing work/chores  Not difficult at all       GEN: WDWN, NAD, Non-toxic HEENT:  Atraumatic, Normocephalic. Neck supple. No masses. CV: RRR, No M/G/R. No JVD. No thrill. No extra heart sounds. PULM: CTA B, no wheezes, crackles, rhonchi. No retractions. No resp. distress. No accessory muscle use. EXTR: No c/c/e NEURO Normal gait.  PSYCH: Normally interactive. Conversant.    All labs reviewed with patient. Results  for orders placed or performed in visit on 03/03/23  CBC with Differential/Platelet  Result Value Ref Range   WBC 7.3 3.4 - 10.8 x10E3/uL   RBC 5.29 (H) 3.77 - 5.28 x10E6/uL   Hemoglobin 14.7 11.1 - 15.9 g/dL   Hematocrit 16.1 09.6 - 46.6 %   MCV 86 79 - 97 fL   MCH 27.8 26.6 - 33.0 pg   MCHC 32.5 31.5 - 35.7 g/dL   RDW 04.5 40.9 - 81.1 %   Platelets 272 150 - 450 x10E3/uL   Neutrophils 38 Not Estab. %   Lymphs 39 Not Estab. %   Monocytes 9 Not Estab. %   Eos 13 Not Estab. %   Basos 1 Not Estab. %   Neutrophils Absolute 2.8 1.4 - 7.0 x10E3/uL   Lymphocytes Absolute 2.8 0.7 - 3.1 x10E3/uL   Monocytes Absolute 0.7 0.1 - 0.9 x10E3/uL   EOS (ABSOLUTE) 1.0 (H) 0.0 - 0.4 x10E3/uL   Basophils Absolute 0.1 0.0 - 0.2 x10E3/uL   Immature Granulocytes 0 Not Estab. %   Immature Grans (Abs) 0.0 0.0 - 0.1 x10E3/uL  Basic metabolic panel  Result Value Ref Range   Glucose 155 (H) 70 - 99 mg/dL   BUN 22 6 - 24 mg/dL   Creatinine, Ser 9.14 0.57 - 1.00 mg/dL   eGFR 77 >78 GN/FAO/1.30   BUN/Creatinine Ratio 25 (H) 9 - 23   Sodium 139 134 - 144 mmol/L   Potassium 5.0 3.5 - 5.2 mmol/L   Chloride 95 (L) 96 - 106 mmol/L   CO2 27 20 - 29 mmol/L   Calcium 10.8 (H) 8.7 - 10.2 mg/dL  Hepatic function panel  Result Value Ref Range   Total Protein 6.9 6.0 - 8.5 g/dL   Albumin 4.5 3.8 - 4.9 g/dL   Bilirubin Total 1.0 0.0 - 1.2 mg/dL   Bilirubin, Direct 8.65 0.00 - 0.40 mg/dL   Alkaline Phosphatase 58 44 - 121 IU/L   AST 24 0 - 40 IU/L   ALT 26 0 - 32 IU/L  Lipid Panel w/o Chol/HDL Ratio  Result Value Ref Range   Cholesterol, Total 124 100 - 199 mg/dL    Triglycerides 784 (H) 0 - 149 mg/dL   HDL 47 >69 mg/dL   VLDL Cholesterol Cal 32 5 - 40 mg/dL   LDL Chol Calc (NIH) 45 0 - 99 mg/dL  Hgb G2X w/o eAG  Result Value Ref Range   Hgb A1c MFr Bld 6.2 (H) 4.8 - 5.6 %   No results found.  Assessment and Plan:     ICD-10-CM   1. Controlled type 2 diabetes mellitus without complication, without long-term current use of insulin (HCC)  E11.9 Microalbumin / creatinine urine ratio    2. Essential hypertension  I10      She really is doing great from a medical standpoint.  Diabetes is fully well-controlled with an A1c of 6.2.  She has lost 30 pounds.  She has had a fantastic result from Ozempic.  Blood pressure and lipids are stable.  She does check her blood pressure at home, and is normally more like 120/70.  She historically has gotten quite nervous coming into the doctor's office at all, so it is mildly elevated today.  She is a great historian, so I think it is reasonable to go with her own blood pressure she gets at home.  Disposition: f/u 6 mo DM  No future appointments.   No orders of the defined types were placed  in this encounter.  There are no discontinued medications. Orders Placed This Encounter  Procedures   Microalbumin / creatinine urine ratio    Signed,  Gargi Berch T. Nandika Stetzer, MD   Allergies as of 03/18/2023       Reactions   Naproxen    REACTION: Rash   Bextra [valdecoxib] Rash   Metaxalone Rash   REACTION: Rash        Medication List        Accurate as of March 18, 2023 11:59 PM. If you have any questions, ask your nurse or doctor.          Contour Next Test test strip Generic drug: glucose blood Use to check blood sugar up to 2 times a day   hydrochlorothiazide 12.5 MG tablet Commonly known as: HYDRODIURIL Take 1 tablet (12.5 mg total) by mouth daily.   lisinopril 40 MG tablet Commonly known as: ZESTRIL TAKE 1 TABLET BY MOUTH DAILY   metFORMIN 500 MG 24 hr tablet Commonly known as:  GLUCOPHAGE-XR Take 4 tablets (2,000 mg total) by mouth daily with breakfast.   Microlet Lancets Misc Use to check blood sugar up to 2 times a day   multivitamin tablet Take 1 tablet by mouth daily.   NONFORMULARY OR COMPOUNDED ITEM Epic Account: [id number] Lab Studies: BMP, CBC with diff, HFP: Z79.899 FLP: E78.5 Hemoglobin A1c: z13.1   rosuvastatin 40 MG tablet Commonly known as: CRESTOR TAKE 1 TABLET BY MOUTH DAILY   Semaglutide (2 MG/DOSE) 8 MG/3ML Sopn Inject 2 mg as directed once a week.

## 2023-03-18 ENCOUNTER — Encounter: Payer: Self-pay | Admitting: Family Medicine

## 2023-03-18 ENCOUNTER — Ambulatory Visit: Payer: Managed Care, Other (non HMO) | Admitting: Family Medicine

## 2023-03-18 VITALS — BP 142/70 | HR 106 | Temp 97.3°F | Ht 67.0 in | Wt 219.5 lb

## 2023-03-18 DIAGNOSIS — Z7984 Long term (current) use of oral hypoglycemic drugs: Secondary | ICD-10-CM | POA: Diagnosis not present

## 2023-03-18 DIAGNOSIS — Z7985 Long-term (current) use of injectable non-insulin antidiabetic drugs: Secondary | ICD-10-CM

## 2023-03-18 DIAGNOSIS — E119 Type 2 diabetes mellitus without complications: Secondary | ICD-10-CM

## 2023-03-18 DIAGNOSIS — Z0000 Encounter for general adult medical examination without abnormal findings: Secondary | ICD-10-CM

## 2023-03-18 DIAGNOSIS — I10 Essential (primary) hypertension: Secondary | ICD-10-CM

## 2023-03-18 NOTE — Patient Instructions (Signed)
Black cohosh - for menopause

## 2023-03-20 ENCOUNTER — Encounter: Payer: Self-pay | Admitting: Family Medicine

## 2023-03-30 ENCOUNTER — Encounter: Payer: Self-pay | Admitting: Emergency Medicine

## 2023-03-30 ENCOUNTER — Ambulatory Visit
Admission: EM | Admit: 2023-03-30 | Discharge: 2023-03-30 | Disposition: A | Discharge status: Home / Self Care | Payer: Managed Care, Other (non HMO) | Attending: Emergency Medicine | Admitting: Emergency Medicine

## 2023-03-30 DIAGNOSIS — U071 COVID-19: Secondary | ICD-10-CM | POA: Diagnosis not present

## 2023-03-30 MED ORDER — NIRMATRELVIR/RITONAVIR (PAXLOVID)TABLET: 3.0000 | ORAL_TABLET | Freq: Two times a day (BID) | ORAL | 0 refills | Status: AC

## 2023-03-30 NOTE — ED Triage Notes (Signed)
Pt presents with right ear pain, head congestions and cough x 2 days. At home covid test today was positive.

## 2023-03-30 NOTE — ED Provider Notes (Signed)
Joyce Rowe    CSN: [id number] Arrival date & time: 03/30/23  1213      History   Chief Complaint Chief Complaint  Patient presents with   Covid Positive   Otalgia   Cough    HPI Joyce Rowe is a 57 y.o. female.  Patient presents with 3 day history of ear pain, congestion, cough.  Positive COVID test at home today.  She denies fever, shortness of breath, or other symptoms.  She took OTC cough medication today.  Her medical history includes diabetes and hypertension.  The history is provided by the patient and medical records.    Past Medical History:  Diagnosis Date   Diabetes mellitus type 2, uncontrolled, without complications 04/03/2015   Hyperlipidemia    Hypertension    Kidney stone     Patient Active Problem List   Diagnosis Date Noted   Kidney stones 12/28/2015   Cysts of both ovaries 12/28/2015   Essential hypertension 08/21/2015   Controlled type 2 diabetes mellitus without complication, without long-term current use of insulin (HCC) 04/03/2015   Hyperlipidemia LDL goal <70 03/28/2014    Past Surgical History:  Procedure Laterality Date   CESAREAN SECTION  08/2002   EXTRACORPOREAL SHOCK WAVE LITHOTRIPSY Left 12/12/2015   Procedure: EXTRACORPOREAL SHOCK WAVE LITHOTRIPSY (ESWL);  Surgeon: Vanna Scotland, MD;  Location: [facility] OR[facility];  Service: Urology;  Laterality: Left;   LAPAROSCOPIC BILATERAL SALPINGECTOMY Bilateral 02/27/2016   Procedure: LAPAROSCOPIC BILATERAL SALPINGECTOMY;  Surgeon: Conard Novak, MD;  Location: [facility] OR[facility];  Service: Gynecology;  Laterality: Bilateral;   LAPAROSCOPIC OVARIAN CYSTECTOMY Right 02/27/2016   Procedure: LAPAROSCOPIC OVARIAN CYSTECTOMY;  Surgeon: Conard Novak, MD;  Location: [facility];  Service: Gynecology;  Laterality: Right;   tubes in ears     70's    OB History     Gravida  2   Para  1   Term  1   Preterm      AB  1   Living  1      SAB  1   IAB      Ectopic      Multiple      Live  Births  1            Home Medications    Prior to Admission medications   Medication Sig Start Date End Date Taking? Authorizing Provider  nirmatrelvir/ritonavir (PAXLOVID) 20 x 150 MG & 10 x 100MG  TABS Take 3 tablets by mouth 2 (two) times daily for 5 days. Take nirmatrelvir (150 mg) two tablets twice daily for 5 days and ritonavir (100 mg) one tablet twice daily for 5 days. 03/30/23 04/04/23 Yes Mickie Bail, NP  glucose blood (CONTOUR NEXT TEST) test strip Use to check blood sugar up to 2 times a day 06/15/22   Copland, Karleen Hampshire, MD  hydrochlorothiazide (HYDRODIURIL) 12.5 MG tablet Take 1 tablet (12.5 mg total) by mouth daily. 03/23/22   Copland, Karleen Hampshire, MD  lisinopril (ZESTRIL) 40 MG tablet TAKE 1 TABLET BY MOUTH DAILY 03/27/22   Copland, Karleen Hampshire, MD  metFORMIN (GLUCOPHAGE-XR) 500 MG 24 hr tablet Take 4 tablets (2,000 mg total) by mouth daily with breakfast. 03/23/22   Copland, Karleen Hampshire, MD  Microlet Lancets MISC Use to check blood sugar up to 2 times a day 06/15/22   Copland, Karleen Hampshire, MD  Multiple Vitamin (MULTIVITAMIN) tablet Take 1 tablet by mouth daily.    [provider]  NONFORMULARY OR COMPOUNDED ITEM Epic Account: [id number] Lab Studies:  BMP, CBC with diff, HFP: Z79.899 FLP: E78.5 Hemoglobin A1c: z13.1 02/08/23   Copland, Karleen Hampshire, MD  rosuvastatin (CRESTOR) 40 MG tablet TAKE 1 TABLET BY MOUTH DAILY 03/27/22   Copland, Karleen Hampshire, MD  Semaglutide, 2 MG/DOSE, 8 MG/3ML SOPN Inject 2 mg as directed once a week. 06/15/22   CoplandKarleen Hampshire, MD    Family History Family History  Problem Relation Age of Onset   Hypertension Mother    Breast cancer Mother 36   Hypertension Father    Hematuria Father    Colon cancer Other        Grandparents  <60   Heart disease Other        Grandparents   Diabetes Maternal Grandmother    Kidney disease Neg Hx    Bladder Cancer Neg Hx    Kidney cancer Neg Hx     Social History Social History   Tobacco Use   Smoking status: Never    Smokeless tobacco: Never  Vaping Use   Vaping status: Never Used  Substance Use Topics   Alcohol use: No   Drug use: No     Allergies   Naproxen, Bextra [valdecoxib], and Metaxalone   Review of Systems Review of Systems  Constitutional:  Negative for chills and fever.  HENT:  Positive for congestion and ear pain. Negative for sore throat.   Respiratory:  Positive for cough. Negative for shortness of breath.   Cardiovascular:  Negative for chest pain and palpitations.  Gastrointestinal:  Negative for diarrhea and vomiting.     Physical Exam Triage Vital Signs ED Triage Vitals [03/30/23 1221]  Encounter Vitals Group     BP      Systolic BP Percentile      Diastolic BP Percentile      Pulse Rate (!) 126     Resp 18     Temp 98.9 F (37.2 C)     Temp src      SpO2 98 %     Weight      Height      Head Circumference      Peak Flow      Pain Score      Pain Loc      Pain Education      Exclude from Growth Chart    No data found.  Updated Vital Signs BP 122/83 (BP Location: Left Arm)   Pulse (!) 116   Temp 98.9 F (37.2 C)   Resp 18   LMP 11/28/2020   SpO2 98%   Visual Acuity Right Eye Distance:   Left Eye Distance:   Bilateral Distance:    Right Eye Near:   Left Eye Near:    Bilateral Near:     Physical Exam Vitals and nursing note reviewed.  Constitutional:      General: She is not in acute distress.    Appearance: She is well-developed.  HENT:     Right Ear: Tympanic membrane normal.     Left Ear: Tympanic membrane normal.     Nose: Rhinorrhea present.     Mouth/Throat:     Mouth: Mucous membranes are moist.     Pharynx: Oropharynx is clear.  Cardiovascular:     Rate and Rhythm: Normal rate and regular rhythm.     Heart sounds: Normal heart sounds.  Pulmonary:     Effort: Pulmonary effort is normal. No respiratory distress.     Breath sounds: Normal breath sounds.  Musculoskeletal:     Cervical back:  Neck supple.  Skin:    General:  Skin is warm and dry.  Neurological:     Mental Status: She is alert.      UC Treatments / Results  Labs (all labs ordered are listed, but only abnormal results are displayed) Labs Reviewed - No data to display  EKG   Radiology No results found.  Procedures Procedures (including critical care time)  Medications Ordered in UC Medications - No data to display  Initial Impression / Assessment and Plan / UC Course  I have reviewed the triage vital signs and the nursing notes.  Pertinent labs & imaging results that were available during my care of the patient were reviewed by me and considered in my medical decision making (see chart for details).   COVID-19.  Patient test positive for COVID at home today.  She has been symptomatic for 3 days.  Discussed treatment options.  Treating with Paxlovid.  GFR 77 on 03/03/2023.  I discussed the side effects of Paxlovid, including dysgeusia, diarrhea, myalgias, hypertension.  I also discussed the possibility of rebound COVID.  Instructed patient to notify her PCP that she is COVID positive and taking Paxlovid.  ED precautions discussed if she develops shortness of breath or other concerning symptoms.  Instructed patient pause her statin for 8-10 days.  She agrees to plan of care.    Final Clinical Impressions(s) / UC Diagnoses   Final diagnoses:  COVID-19     Discharge Instructions      Take the Paxlovid as directed.  Take Tylenol as needed for fever or discomfort.  Rest and keep yourself hydrated.     Go to the emergency department if you have shortness of breath or other concerning symptoms.    Call your primary care provider to let them know that you are COVID positive and taking Paxlovid.         ED Prescriptions     Medication Sig Dispense Auth. Provider   nirmatrelvir/ritonavir (PAXLOVID) 20 x 150 MG & 10 x 100MG  TABS Take 3 tablets by mouth 2 (two) times daily for 5 days. Take nirmatrelvir (150 mg) two tablets twice  daily for 5 days and ritonavir (100 mg) one tablet twice daily for 5 days. 30 tablet Mickie Bail, NP      PDMP not reviewed this encounter.   Mickie Bail, NP 03/30/23 1254

## 2023-03-30 NOTE — Discharge Instructions (Addendum)
 Take the Paxlovid as directed.  Take Tylenol as needed for fever or discomfort.  Rest and keep yourself hydrated.      Go to the emergency department if you have shortness of breath or other concerning symptoms.    Call your primary care provider to let them know that you are COVID positive and taking Paxlovid.

## 2023-04-01 ENCOUNTER — Other Ambulatory Visit: Payer: Self-pay | Admitting: Family Medicine

## 2023-04-01 DIAGNOSIS — Z1231 Encounter for screening mammogram for malignant neoplasm of breast: Secondary | ICD-10-CM

## 2023-04-15 ENCOUNTER — Other Ambulatory Visit: Payer: Self-pay | Admitting: Family Medicine

## 2023-04-15 DIAGNOSIS — E119 Type 2 diabetes mellitus without complications: Secondary | ICD-10-CM

## 2023-04-28 ENCOUNTER — Other Ambulatory Visit (HOSPITAL_COMMUNITY): Payer: Self-pay

## 2023-05-06 LAB — HM DIABETES EYE EXAM

## 2023-05-09 ENCOUNTER — Other Ambulatory Visit: Payer: Self-pay | Admitting: Family Medicine

## 2023-05-11 NOTE — Telephone Encounter (Signed)
Ozempic Last filled:  02/17/23, #9 mL Last OV:  03/18/23 Next OV:  none

## 2023-05-13 ENCOUNTER — Other Ambulatory Visit: Payer: Self-pay | Admitting: Family Medicine

## 2023-05-13 ENCOUNTER — Encounter: Payer: Self-pay | Admitting: Family Medicine

## 2023-05-13 DIAGNOSIS — E7800 Pure hypercholesterolemia, unspecified: Secondary | ICD-10-CM

## 2023-05-13 DIAGNOSIS — I10 Essential (primary) hypertension: Secondary | ICD-10-CM

## 2023-05-13 NOTE — Telephone Encounter (Signed)
E-scribed refills.   Please schedule CPE and fasting lab (no food/drink- except water and/or blk coffee 5 hrs prior) visits for additional refills.

## 2023-05-13 NOTE — Telephone Encounter (Signed)
Spoke to pt, pt states he'll call back to schedule

## 2023-05-14 MED ORDER — LISINOPRIL 40 MG PO TABS
40.0000 mg | ORAL_TABLET | Freq: Every day | ORAL | 3 refills | Status: DC
Start: 2023-05-14 — End: 2024-04-18

## 2023-05-14 NOTE — Telephone Encounter (Signed)
Noted  

## 2023-05-14 NOTE — Telephone Encounter (Signed)
FYI:  the patient was just seen in 03/2023, so no physical needed.  It would be ok to refill for one year.  She already has a follow-up appointment scheduled with me for 6 month DM follow-up.

## 2023-05-19 ENCOUNTER — Ambulatory Visit
Admission: RE | Admit: 2023-05-19 | Discharge: 2023-05-19 | Disposition: A | Discharge status: Home / Self Care | Payer: Managed Care, Other (non HMO) | Source: Ambulatory Visit | Attending: Family Medicine | Admitting: Family Medicine

## 2023-05-19 DIAGNOSIS — Z1231 Encounter for screening mammogram for malignant neoplasm of breast: Secondary | ICD-10-CM | POA: Diagnosis present

## 2023-05-20 ENCOUNTER — Encounter: Payer: Self-pay | Admitting: Family Medicine

## 2023-06-05 ENCOUNTER — Other Ambulatory Visit: Payer: Self-pay | Admitting: Family Medicine

## 2023-06-10 ENCOUNTER — Encounter: Payer: Self-pay | Admitting: Family Medicine

## 2023-06-10 NOTE — Telephone Encounter (Signed)
Please submit PA for Ozempic  

## 2023-08-27 ENCOUNTER — Other Ambulatory Visit: Payer: Self-pay | Admitting: Family Medicine

## 2023-08-27 DIAGNOSIS — E7800 Pure hypercholesterolemia, unspecified: Secondary | ICD-10-CM

## 2023-08-27 DIAGNOSIS — E119 Type 2 diabetes mellitus without complications: Secondary | ICD-10-CM

## 2023-08-27 MED ORDER — METFORMIN HCL ER 500 MG PO TB24: 2000.0000 mg | ORAL_TABLET | Freq: Every day | ORAL | 1 refills | Status: DC

## 2023-08-27 MED ORDER — HYDROCHLOROTHIAZIDE 12.5 MG PO TABS: 12.5000 mg | ORAL_TABLET | Freq: Every day | ORAL | 1 refills | Status: DC

## 2024-01-20 ENCOUNTER — Other Ambulatory Visit: Payer: Self-pay | Admitting: Family Medicine

## 2024-01-31 ENCOUNTER — Encounter: Payer: Self-pay | Admitting: Family Medicine

## 2024-02-01 ENCOUNTER — Other Ambulatory Visit: Payer: Self-pay | Admitting: Family Medicine

## 2024-02-01 DIAGNOSIS — E785 Hyperlipidemia, unspecified: Secondary | ICD-10-CM

## 2024-02-01 DIAGNOSIS — E119 Type 2 diabetes mellitus without complications: Secondary | ICD-10-CM

## 2024-02-01 DIAGNOSIS — Z79899 Other long term (current) drug therapy: Secondary | ICD-10-CM

## 2024-02-15 ENCOUNTER — Other Ambulatory Visit: Payer: Self-pay | Admitting: Family Medicine

## 2024-02-15 DIAGNOSIS — E119 Type 2 diabetes mellitus without complications: Secondary | ICD-10-CM

## 2024-02-15 DIAGNOSIS — E7800 Pure hypercholesterolemia, unspecified: Secondary | ICD-10-CM

## 2024-02-22 LAB — CBC WITH DIFFERENTIAL/PLATELET
Basophils Absolute: 0.1 x10E3/uL (ref 0.0–0.2)
Basos: 1 %
EOS (ABSOLUTE): 1.2 x10E3/uL — ABNORMAL HIGH (ref 0.0–0.4)
Eos: 15 %
Hematocrit: 46.6 % (ref 34.0–46.6)
Hemoglobin: 15.2 g/dL (ref 11.1–15.9)
Immature Grans (Abs): 0 x10E3/uL (ref 0.0–0.1)
Immature Granulocytes: 0 %
Lymphocytes Absolute: 2.9 x10E3/uL (ref 0.7–3.1)
Lymphs: 35 %
MCH: 28.5 pg (ref 26.6–33.0)
MCHC: 32.6 g/dL (ref 31.5–35.7)
MCV: 87 fL (ref 79–97)
Monocytes Absolute: 0.7 x10E3/uL (ref 0.1–0.9)
Monocytes: 8 %
Neutrophils Absolute: 3.4 x10E3/uL (ref 1.4–7.0)
Neutrophils: 41 %
Platelets: 242 x10E3/uL (ref 150–450)
RBC: 5.33 x10E6/uL — ABNORMAL HIGH (ref 3.77–5.28)
RDW: 13 % (ref 11.7–15.4)
WBC: 8.4 x10E3/uL (ref 3.4–10.8)

## 2024-02-22 LAB — BASIC METABOLIC PANEL WITH GFR
BUN/Creatinine Ratio: 18 (ref 9–23)
BUN: 16 mg/dL (ref 6–24)
CO2: 22 mmol/L (ref 20–29)
Calcium: 10.1 mg/dL (ref 8.7–10.2)
Chloride: 98 mmol/L (ref 96–106)
Creatinine, Ser: 0.89 mg/dL (ref 0.57–1.00)
Glucose: 124 mg/dL — ABNORMAL HIGH (ref 70–99)
Potassium: 4.5 mmol/L (ref 3.5–5.2)
Sodium: 140 mmol/L (ref 134–144)
eGFR: 76 mL/min/1.73 (ref >59)

## 2024-02-22 LAB — HEPATIC FUNCTION PANEL
ALT: 21 IU/L (ref 0–32)
AST: 22 IU/L (ref 0–40)
Albumin: 4.5 g/dL (ref 3.8–4.9)
Alkaline Phosphatase: 61 IU/L (ref 44–121)
Bilirubin Total: 0.7 mg/dL (ref 0.0–1.2)
Bilirubin, Direct: 0.24 mg/dL (ref 0.00–0.40)
Total Protein: 6.6 g/dL (ref 6.0–8.5)

## 2024-02-22 LAB — LIPID PANEL W/O CHOL/HDL RATIO
Cholesterol, Total: 127 mg/dL (ref 100–199)
HDL: 48 mg/dL (ref >39)
LDL Chol Calc (NIH): 51 mg/dL (ref 0–99)
Triglycerides: 166 mg/dL — ABNORMAL HIGH (ref 0–149)
VLDL Cholesterol Cal: 28 mg/dL (ref 5–40)

## 2024-02-22 LAB — MICROALBUMIN / CREATININE URINE RATIO
Creatinine, Urine: 61.6 mg/dL
Microalb/Creat Ratio: 7 mg/g{creat} (ref 0–29)
Microalbumin, Urine: 4.4 ug/mL

## 2024-02-22 LAB — HGB A1C W/O EAG: Hgb A1c MFr Bld: 5.5 % (ref 4.8–5.6)

## 2024-02-29 NOTE — Progress Notes (Unsigned)
 Alyvia Derk T. Deirdra Heumann, MD, CAQ Sports Medicine [facility] at [facility] [address] [place], [address]  Phone: [phone]  FAX: [phone]  Joyce Rowe - 58 y.o. female  MRN [id number]  Date of Birth: 16-Oct-1965  Date: 03/02/2024  PCP: Watt Mirza, MD  Referral: Watt Mirza, MD  No chief complaint on file.  Patient Care Team: Watt Mirza, MD as PCP - General Subjective:   Joyce Rowe is a 58 y.o. pleasant patient who presents with the following:  Health Maintenance Summary Reviewed and updated, unless pt declines services.  Tobacco History Reviewed. Non-smoker Alcohol: No concerns, no excessive use Exercise Habits: Some activity, rec at least 30 mins 5 times a week STD concerns: none Drug Use: None Lumps or breast concerns: no  HIV Hep C Prevnar 20 Tdap Cologuard is due October 2025  Health Maintenance  Topic Date Due   HIV Screening  Never done   Hepatitis C Screening  Never done   Hepatitis B Vaccines (1 of 3 - 19+ 3-dose series) Never done   Pneumococcal Vaccine 62-78 Years old (2 of 2 - PCV) 05/12/2016   COVID-19 Vaccine (7 - 2024-25 season) 07/09/2023   INFLUENZA VACCINE  03/10/2024   FOOT EXAM  03/17/2024   DTaP/Tdap/Td (2 - Td or Tdap) 03/28/2024   OPHTHALMOLOGY EXAM  05/05/2024   MAMMOGRAM  05/18/2024   Fecal DNA (Cologuard)  06/03/2024   HEMOGLOBIN A1C  08/23/2024   Diabetic kidney evaluation - eGFR measurement  02/20/2025   Diabetic kidney evaluation - Urine ACR  02/20/2025   Cervical Cancer Screening (HPV/Pap Cotest)  12/06/2025   Zoster Vaccines- Shingrix  Completed   HPV VACCINES  Aged Out   Meningococcal B Vaccine  Aged Out    Immunization History  Administered Date(s) Administered   Influenza,inj,Quad PF,6+ Mos 05/13/2015, 05/15/2019   Influenza-Unspecified 06/13/2021, 06/05/2022, 05/14/2023   PFIZER(Purple Top)SARS-COV-2 Vaccination 10/26/2019, 11/16/2019, 06/04/2020, 06/13/2021,  05/14/2023   Pfizer Covid-19 Vaccine Bivalent Booster 12yrs & up 06/05/2022   Pneumococcal Polysaccharide-23 05/13/2015   Tdap 03/28/2014   Zoster Recombinant(Shingrix) 03/19/2021, 08/24/2021   Patient Active Problem List   Diagnosis Date Noted   Controlled type 2 diabetes mellitus without complication, without long-term current use of insulin (HCC) 04/03/2015    Priority: High   Essential hypertension 08/21/2015    Priority: Medium    Hyperlipidemia LDL goal <70 03/28/2014    Priority: Medium    Kidney stones 12/28/2015   Cysts of both ovaries 12/28/2015    Past Medical History:  Diagnosis Date   Diabetes mellitus type 2, uncontrolled, without complications 04/03/2015   Hyperlipidemia    Hypertension    Kidney stone     Past Surgical History:  Procedure Laterality Date   CESAREAN SECTION  08/2002   EXTRACORPOREAL SHOCK WAVE LITHOTRIPSY Left 12/12/2015   Procedure: EXTRACORPOREAL SHOCK WAVE LITHOTRIPSY (ESWL);  Surgeon: Rosina Riis, MD;  Location: [facility] OR[facility];  Service: Urology;  Laterality: Left;   LAPAROSCOPIC BILATERAL SALPINGECTOMY Bilateral 02/27/2016   Procedure: LAPAROSCOPIC BILATERAL SALPINGECTOMY;  Surgeon: Garnette JONETTA Mace, MD;  Location: [facility];  Service: Gynecology;  Laterality: Bilateral;   LAPAROSCOPIC OVARIAN CYSTECTOMY Right 02/27/2016   Procedure: LAPAROSCOPIC OVARIAN CYSTECTOMY;  Surgeon: Garnette JONETTA Mace, MD;  Location: [facility] OR[facility];  Service: Gynecology;  Laterality: Right;   tubes in ears     70's    Family History  Problem Relation Age of Onset   Hypertension Mother    Breast cancer Mother  75   Hypertension Father    Hematuria Father    Colon cancer Other        Grandparents  <60   Heart disease Other        Grandparents   Diabetes Maternal Grandmother    Kidney disease Neg Hx    Bladder Cancer Neg Hx    Kidney cancer Neg Hx     Social History   Social History Narrative   Married      1 child      BS, SAHM      6-8 hours sleep per night       3 people living in home      Regular exercise-yes    Past Medical History, Surgical History, Social History, Family History, Problem List, Medications, and Allergies have been reviewed and updated if relevant.  Review of Systems: Pertinent positives are listed above.  Otherwise, a full 14 point review of systems has been done in full and it is negative except where it is noted positive.  Objective:   LMP 11/28/2020  Ideal Body Weight:   No results found.    03/23/2022    8:12 AM 12/06/2020    8:42 AM 05/15/2019    2:19 PM 02/23/2018    8:08 AM 02/08/2017    8:15 AM  Depression screen PHQ 2/9  Decreased Interest 0 0 0 0 0  Down, Depressed, Hopeless 0 0 0 0 0  PHQ - 2 Score 0 0 0 0 0  Altered sleeping  0     Tired, decreased energy  0     Change in appetite  0     Feeling bad or failure about yourself   0     Trouble concentrating  0     Moving slowly or fidgety/restless  0     Suicidal thoughts  0     PHQ-9 Score  0     Difficult doing work/chores  Not difficult at all        GEN: well developed, well nourished, no acute distress Eyes: conjunctiva and lids normal, PERRLA, EOMI ENT: TM clear, nares clear, oral exam WNL Neck: supple, no lymphadenopathy, no thyromegaly, no JVD Pulm: clear to auscultation and percussion, respiratory effort normal CV: regular rate and rhythm, S1-S2, no murmur, rub or gallop, no bruits Chest: no scars, masses, no lumps BREAST: breast exam declined GI: soft, non-tender; no hepatosplenomegaly, masses; active bowel sounds all quadrants GU: GU exam declined Lymph: no cervical, axillary or inguinal adenopathy MSK: gait normal, muscle tone and strength WNL, no joint swelling, effusions, discoloration, crepitus  SKIN: clear, good turgor, color WNL, no rashes, lesions, or ulcerations Neuro: normal mental status, normal strength, sensation, and motion Psych: alert; oriented to person, place and time, normally interactive and not anxious or  depressed in appearance.   All labs reviewed with patient. Results for orders placed or performed in visit on 02/01/24  Microalbumin / creatinine urine ratio   Collection Time: 02/21/24  8:18 AM  Result Value Ref Range   Creatinine, Urine 61.6 Not Estab. mg/dL   Microalbumin, Urine 4.4 Not Estab. ug/mL   Microalb/Creat Ratio 7 0 - 29 mg/g creat  Hgb A1c w/o eAG   Collection Time: 02/21/24  8:18 AM  Result Value Ref Range   Hgb A1c MFr Bld 5.5 4.8 - 5.6 %  Lipid Panel w/o Chol/HDL Ratio   Collection Time: 02/21/24  8:18 AM  Result Value Ref Range   Cholesterol, Total 127  100 - 199 mg/dL   Triglycerides 833 (H) 0 - 149 mg/dL   HDL 48 >60 mg/dL   VLDL Cholesterol Cal 28 5 - 40 mg/dL   LDL Chol Calc (NIH) 51 0 - 99 mg/dL  Hepatic function panel   Collection Time: 02/21/24  8:18 AM  Result Value Ref Range   Total Protein 6.6 6.0 - 8.5 g/dL   Albumin 4.5 3.8 - 4.9 g/dL   Bilirubin Total 0.7 0.0 - 1.2 mg/dL   Bilirubin, Direct 9.75 0.00 - 0.40 mg/dL   Alkaline Phosphatase 61 44 - 121 IU/L   AST 22 0 - 40 IU/L   ALT 21 0 - 32 IU/L  Basic metabolic panel   Collection Time: 02/21/24  8:18 AM  Result Value Ref Range   Glucose 124 (H) 70 - 99 mg/dL   BUN 16 6 - 24 mg/dL   Creatinine, Ser 9.10 0.57 - 1.00 mg/dL   eGFR 76 >40 fO/fpw/8.26   BUN/Creatinine Ratio 18 9 - 23   Sodium 140 134 - 144 mmol/L   Potassium 4.5 3.5 - 5.2 mmol/L   Chloride 98 96 - 106 mmol/L   CO2 22 20 - 29 mmol/L   Calcium  10.1 8.7 - 10.2 mg/dL  CBC with Differential/Platelet   Collection Time: 02/21/24  8:18 AM  Result Value Ref Range   WBC 8.4 3.4 - 10.8 x10E3/uL   RBC 5.33 (H) 3.77 - 5.28 x10E6/uL   Hemoglobin 15.2 11.1 - 15.9 g/dL   Hematocrit 53.3 65.9 - 46.6 %   MCV 87 79 - 97 fL   MCH 28.5 26.6 - 33.0 pg   MCHC 32.6 31.5 - 35.7 g/dL   RDW 86.9 88.2 - 84.5 %   Platelets 242 150 - 450 x10E3/uL   Neutrophils 41 Not Estab. %   Lymphs 35 Not Estab. %   Monocytes 8 Not Estab. %   Eos 15 Not Estab.  %   Basos 1 Not Estab. %   Neutrophils Absolute 3.4 1.4 - 7.0 x10E3/uL   Lymphocytes Absolute 2.9 0.7 - 3.1 x10E3/uL   Monocytes Absolute 0.7 0.1 - 0.9 x10E3/uL   EOS (ABSOLUTE) 1.2 (H) 0.0 - 0.4 x10E3/uL   Basophils Absolute 0.1 0.0 - 0.2 x10E3/uL   Immature Granulocytes 0 Not Estab. %   Immature Grans (Abs) 0.0 0.0 - 0.1 x10E3/uL   No results found.  Assessment and Plan:     ICD-10-CM   1. Healthcare maintenance  Z00.00       Health Maintenance Exam: The patient's preventative maintenance and recommended screening tests for an annual wellness exam were reviewed in full today. Brought up to date unless services declined.  Counselled on the importance of diet, exercise, and its role in overall health and mortality. The patient's FH and SH was reviewed, including their home life, tobacco status, and drug and alcohol status.  Follow-up in 1 year for physical exam or additional follow-up below.  Disposition: No follow-ups on file.  Future Appointments  Date Time Provider Department Center  03/02/2024  9:40 AM Shaterrica Territo, Jacques, MD [facility]    No orders of the defined types were placed in this encounter.  There are no discontinued medications. No orders of the defined types were placed in this encounter.   Signed,  Jacques DASEN. Hopelynn Gartland, MD   Allergies as of 03/02/2024       Reactions   Naproxen    REACTION: Rash   Bextra [valdecoxib] Rash   Metaxalone Rash  REACTION: Rash        Medication List        Accurate as of February 29, 2024  4:57 PM. If you have any questions, ask your nurse or doctor.          Contour Next Test test strip Generic drug: glucose blood Use to check blood sugar up to 2 times a day   hydrochlorothiazide  12.5 MG tablet Commonly known as: HYDRODIURIL  TAKE 1 TABLET BY MOUTH DAILY   lisinopril  40 MG tablet Commonly known as: ZESTRIL  Take 1 tablet (40 mg total) by mouth daily.   metFORMIN  500 MG 24 hr tablet Commonly known as:  GLUCOPHAGE -XR TAKE 4 TABLETS BY MOUTH DAILY  WITH BREAKFAST   Microlet Lancets Misc Use to check blood sugar up to 2 times a day   multivitamin tablet Take 1 tablet by mouth daily.   NONFORMULARY OR COMPOUNDED ITEM Epic Account: [id number] Lab Studies: BMP, CBC with diff, HFP: Z79.899 FLP: E78.5 Hemoglobin A1c: z13.1   Ozempic  (2 MG/DOSE) 8 MG/3ML Sopn Generic drug: Semaglutide  (2 MG/DOSE) INJECT 2 MG UNDER THE SKIN ONCE WEEKLY   rosuvastatin  40 MG tablet Commonly known as: CRESTOR  TAKE 1 TABLET BY MOUTH DAILY

## 2024-03-02 ENCOUNTER — Ambulatory Visit (INDEPENDENT_AMBULATORY_CARE_PROVIDER_SITE_OTHER): Admitting: Family Medicine

## 2024-03-02 ENCOUNTER — Encounter: Payer: Self-pay | Admitting: Family Medicine

## 2024-03-02 VITALS — BP 126/60 | HR 107 | Temp 97.5°F | Ht 67.0 in | Wt 212.5 lb

## 2024-03-02 DIAGNOSIS — Z7984 Long term (current) use of oral hypoglycemic drugs: Secondary | ICD-10-CM

## 2024-03-02 DIAGNOSIS — Z23 Encounter for immunization: Secondary | ICD-10-CM

## 2024-03-02 DIAGNOSIS — E119 Type 2 diabetes mellitus without complications: Secondary | ICD-10-CM | POA: Diagnosis not present

## 2024-03-02 DIAGNOSIS — Z0000 Encounter for general adult medical examination without abnormal findings: Secondary | ICD-10-CM | POA: Diagnosis not present

## 2024-03-02 MED ORDER — CONTOUR NEXT TEST VI STRP: ORAL_STRIP | 3 refills | Status: AC

## 2024-03-02 NOTE — Patient Instructions (Signed)
 Mychart message to me in October for Cologuard

## 2024-03-22 ENCOUNTER — Encounter: Payer: Self-pay | Admitting: Family Medicine

## 2024-03-22 MED ORDER — OZEMPIC (2 MG/DOSE) 8 MG/3ML ~~LOC~~ SOPN: 2.0000 mg | PEN_INJECTOR | SUBCUTANEOUS | 3 refills | Status: DC

## 2024-03-22 NOTE — Telephone Encounter (Signed)
 Please submit PA for [facility]

## 2024-03-27 ENCOUNTER — Other Ambulatory Visit (HOSPITAL_COMMUNITY): Payer: Self-pay

## 2024-03-28 ENCOUNTER — Other Ambulatory Visit (HOSPITAL_COMMUNITY): Payer: Self-pay

## 2024-03-28 ENCOUNTER — Telehealth: Payer: Self-pay

## 2024-03-28 NOTE — Telephone Encounter (Signed)
 Pharmacy Patient Advocate Encounter   Received notification from Patient Advice Request messages that prior authorization for OZEMPIC  is required/requested.   Insurance verification completed.   The patient is insured through [facility] .   Per test claim: Refill too soon. PA is not needed at this time. Medication was filled 03/13/24. Next eligible fill date is 04/03/24.  Called insurance at [phone] because [facility] would not process patient's insurance. Per representative, Ozempic  was last filled on 03/13/24 and can be refilled after 04/03/24. Current PA expires 06/09/24.

## 2024-04-04 ENCOUNTER — Other Ambulatory Visit: Payer: Self-pay | Admitting: Family Medicine

## 2024-04-04 DIAGNOSIS — Z1231 Encounter for screening mammogram for malignant neoplasm of breast: Secondary | ICD-10-CM

## 2024-04-07 ENCOUNTER — Other Ambulatory Visit: Payer: Self-pay | Admitting: Family Medicine

## 2024-04-11 ENCOUNTER — Other Ambulatory Visit (HOSPITAL_COMMUNITY): Payer: Self-pay

## 2024-04-12 ENCOUNTER — Other Ambulatory Visit (HOSPITAL_COMMUNITY): Payer: Self-pay

## 2024-04-12 NOTE — Telephone Encounter (Signed)
 Patient has added her [employer] card under demographics.

## 2024-04-13 ENCOUNTER — Other Ambulatory Visit (HOSPITAL_COMMUNITY): Payer: Self-pay

## 2024-04-16 ENCOUNTER — Other Ambulatory Visit: Payer: Self-pay | Admitting: Family Medicine

## 2024-04-18 ENCOUNTER — Other Ambulatory Visit: Payer: Self-pay | Admitting: Family Medicine

## 2024-04-18 DIAGNOSIS — I10 Essential (primary) hypertension: Secondary | ICD-10-CM

## 2024-05-08 ENCOUNTER — Other Ambulatory Visit (HOSPITAL_COMMUNITY): Payer: Self-pay

## 2024-05-08 ENCOUNTER — Telehealth: Payer: Self-pay

## 2024-05-08 NOTE — Telephone Encounter (Signed)
 Pharmacy Patient Advocate Encounter   Received notification from Onbase that prior authorization for Ozempic  8 is due for renewal.   Insurance verification completed.   The patient is insured through [facility].  Action: PA required; PA submitted to above mentioned insurance via Latent Key/confirmation #/EOC [id number] Status is pending

## 2024-05-08 NOTE — Telephone Encounter (Signed)
 Pharmacy Patient Advocate Encounter  Received notification from [employer] that Prior Authorization for Ozempic  8 has been CANCELLED due to There is another PA request not started by this office.  PA #/Case ID/Reference #: # [id number]

## 2024-05-12 ENCOUNTER — Other Ambulatory Visit (HOSPITAL_COMMUNITY): Payer: Self-pay

## 2024-05-12 ENCOUNTER — Telehealth: Payer: Self-pay

## 2024-05-12 NOTE — Telephone Encounter (Signed)
 Pharmacy Patient Advocate Encounter   Received notification from Onbase that prior authorization for Ozempic  8 is required/requested.   Insurance verification completed.   The patient is insured through [facility].   Per test claim: Refill too soon. PA is not needed at this time. Medication was filled 04/07/24. Next eligible fill date is 06/14/24.

## 2024-05-19 ENCOUNTER — Other Ambulatory Visit (HOSPITAL_COMMUNITY): Payer: Self-pay

## 2024-05-19 ENCOUNTER — Telehealth: Payer: Self-pay

## 2024-05-19 ENCOUNTER — Ambulatory Visit
Admission: RE | Admit: 2024-05-19 | Discharge: 2024-05-19 | Disposition: A | Discharge status: Home / Self Care | Source: Ambulatory Visit | Attending: Family Medicine | Admitting: Family Medicine

## 2024-05-19 DIAGNOSIS — Z1231 Encounter for screening mammogram for malignant neoplasm of breast: Secondary | ICD-10-CM | POA: Diagnosis present

## 2024-05-19 NOTE — Telephone Encounter (Signed)
 Pharmacy Patient Advocate Encounter   Received notification from Onbase that prior authorization for Ozempic  8 is required/requested.   Insurance verification completed.   The patient is insured through [facility].   Per test claim: Refill too soon. PA is not needed at this time. Medication was filled 04/07/24. Next eligible fill date is 06/14/24.  This the 3rd request.

## 2024-05-23 ENCOUNTER — Encounter: Payer: Self-pay | Admitting: Family Medicine

## 2024-05-23 DIAGNOSIS — E7800 Pure hypercholesterolemia, unspecified: Secondary | ICD-10-CM

## 2024-05-23 DIAGNOSIS — E119 Type 2 diabetes mellitus without complications: Secondary | ICD-10-CM

## 2024-05-23 DIAGNOSIS — Z1211 Encounter for screening for malignant neoplasm of colon: Secondary | ICD-10-CM

## 2024-05-23 MED ORDER — METFORMIN HCL ER 500 MG PO TB24: 2000.0000 mg | ORAL_TABLET | Freq: Every day | ORAL | 3 refills | Status: AC

## 2024-05-23 MED ORDER — ROSUVASTATIN CALCIUM 40 MG PO TABS: 40.0000 mg | ORAL_TABLET | Freq: Every day | ORAL | 3 refills | Status: AC

## 2024-05-31 LAB — HM DIABETES EYE EXAM

## 2024-06-05 LAB — COLOGUARD: COLOGUARD: NEGATIVE

## 2024-06-06 ENCOUNTER — Ambulatory Visit: Payer: Self-pay | Admitting: Family Medicine

## 2024-06-27 ENCOUNTER — Telehealth: Payer: Self-pay | Admitting: *Deleted

## 2024-06-27 ENCOUNTER — Other Ambulatory Visit (HOSPITAL_COMMUNITY): Payer: Self-pay

## 2024-06-27 NOTE — Telephone Encounter (Signed)
 Copied from CRM #[id number]. Topic: Clinical - Medication Prior Auth >> Jun 27, 2024  3:07 PM Dedra B wrote: Reason for CRM: Pt calling to follow up PA for Ozempic  sent by [employer] on 11/6. Her [employer] case number is [id number].

## 2024-06-28 ENCOUNTER — Other Ambulatory Visit (HOSPITAL_COMMUNITY): Payer: Self-pay

## 2024-06-28 ENCOUNTER — Telehealth: Payer: Self-pay

## 2024-06-28 MED ORDER — OZEMPIC (2 MG/DOSE) 8 MG/3ML ~~LOC~~ SOPN: 2.0000 mg | PEN_INJECTOR | SUBCUTANEOUS | 11 refills | Status: AC

## 2024-06-28 NOTE — Telephone Encounter (Signed)
 Pharmacy Patient Advocate Encounter   Received notification from Pt Calls Messages that prior authorization for Ozempic  8 is required/requested.   Insurance verification completed.   The patient is insured through [facility].   Per test claim: PA required; PA submitted to above mentioned insurance via Latent Key/confirmation #/EOC [facility] Status is pending .  Unable to access patient PA ([id number]) due to HIPAA

## 2024-06-28 NOTE — Telephone Encounter (Signed)
 Pharmacy Patient Advocate Encounter  Received notification from [employer] that Prior Authorization for Ozempic  8 has been CANCELLED due to patient already started PA request that I am unable to access.

## 2024-06-28 NOTE — Telephone Encounter (Signed)
 Pharmacy Patient Advocate Encounter  Received notification from [employer] that Prior Authorization for Ozempic  8 has been APPROVED from 06/28/24 to 06/28/25. Ran test claim, Copay is $24.99. This test claim was processed through [facility]- copay amounts may vary at other pharmacies due to pharmacy/plan contracts, or as the patient moves through the different stages of their insurance plan.   PA #/Case ID/Reference #: [id number]

## 2024-06-28 NOTE — Telephone Encounter (Signed)
 Called and spoke with associate at [facility].  Patient does not need a PA for medication.  The issue is that a 3 month supply was sent to a retail pharmacy.  She can only get a 1 month supply from retail pharmacy.  Called and spoke with patient. She prefers to continue to get medication locally and wants it sent to [facility] for a 30 day supply.  Prescription resent to pharmacy as patient requested.

## 2024-07-25 NOTE — Progress Notes (Unsigned)
° ° ° °  Joyce Rowe T. Joyce Cuen, MD, CAQ Sports Medicine [facility] at [facility] [address] [place], [address]  Phone: [phone]  FAX: [phone]  Joyce Rowe - 58 y.o. female  MRN [id number]  Date of Birth: 1966-06-15  Date: 07/26/2024  PCP: Watt Mirza, MD  Referral: Watt Mirza, MD  No chief complaint on file.  Subjective:   Joyce Rowe is a 58 y.o. very pleasant female patient with There is no height or weight on file to calculate BMI. who presents with the following:  Discussed the use of AI scribe software for clinical note transcription with the patient, who gave verbal consent to proceed.  Patient presents for right-sided leg pain. History of Present Illness     Review of Systems is noted in the HPI, as appropriate  Objective:   LMP 11/28/2020   GEN: No acute distress; alert,appropriate. PULM: Breathing comfortably in no respiratory distress PSYCH: Normally interactive.   Laboratory and Imaging Data:  Assessment and Plan:   No diagnosis found. Assessment & Plan   Medication Management during today's office visit: No orders of the defined types were placed in this encounter.  There are no discontinued medications.  Orders placed today for conditions managed today: No orders of the defined types were placed in this encounter.   Disposition: No follow-ups on file.  Dragon [employer] One speech-to-text software was used for transcription in this dictation.  Possible transcriptional errors can occur using Animal nutritionist.   Signed,  Mirza DASEN. Linette Gunderson, MD   Outpatient Encounter Medications as of 07/26/2024  Medication Sig   glucose blood (CONTOUR NEXT TEST) test strip Use to check blood sugar up to 2 times a day   hydrochlorothiazide  (HYDRODIURIL ) 12.5 MG tablet TAKE 1 TABLET BY MOUTH DAILY   lisinopril  (ZESTRIL ) 40 MG tablet TAKE 1 TABLET BY MOUTH DAILY   metFORMIN  (GLUCOPHAGE -XR) 500 MG 24 hr tablet Take 4  tablets (2,000 mg total) by mouth daily with breakfast.   Microlet Lancets MISC Use to check blood sugar up to 2 times a day   Multiple Vitamin (MULTIVITAMIN) tablet Take 1 tablet by mouth daily.   NONFORMULARY OR COMPOUNDED ITEM Epic Account: [id number] Lab Studies: BMP, CBC with diff, HFP: Z79.899 FLP: E78.5 Hemoglobin A1c: z13.1   rosuvastatin  (CRESTOR ) 40 MG tablet Take 1 tablet (40 mg total) by mouth daily.   Semaglutide , 2 MG/DOSE, (OZEMPIC , 2 MG/DOSE,) 8 MG/3ML SOPN Inject 2 mg into the skin once a week.   No facility-administered encounter medications on file as of 07/26/2024.

## 2024-07-26 ENCOUNTER — Ambulatory Visit
Admission: RE | Admit: 2024-07-26 | Discharge: 2024-07-26 | Disposition: A | Discharge status: Home / Self Care | Source: Ambulatory Visit | Attending: Family Medicine | Admitting: Family Medicine

## 2024-07-26 ENCOUNTER — Encounter: Payer: Self-pay | Admitting: Family Medicine

## 2024-07-26 ENCOUNTER — Ambulatory Visit: Admitting: Family Medicine

## 2024-07-26 VITALS — BP 124/70 | HR 116 | Temp 97.7°F | Ht 67.0 in | Wt 209.5 lb

## 2024-07-26 DIAGNOSIS — M79604 Pain in right leg: Secondary | ICD-10-CM | POA: Diagnosis not present

## 2024-07-26 DIAGNOSIS — M25561 Pain in right knee: Secondary | ICD-10-CM

## 2024-07-26 DIAGNOSIS — M1711 Unilateral primary osteoarthritis, right knee: Secondary | ICD-10-CM

## 2024-07-26 MED ORDER — PREDNISONE 20 MG PO TABS: ORAL_TABLET | ORAL | 0 refills | Status: AC

## 2024-08-08 ENCOUNTER — Ambulatory Visit: Payer: Self-pay | Admitting: Family Medicine

## 2024-08-15 NOTE — Progress Notes (Unsigned)
" ° ° ° °  Joyce Rowe T. Ulla Mckiernan, MD, CAQ Sports Medicine [facility] at [facility] [address] [place], [address]  Phone: [phone]  FAX: [phone]  DANIYAH Rowe - 59 y.o. female  MRN [id number]  Date of Birth: 08/04/1966  Date: 08/16/2024  PCP: Watt Mirza, MD  Referral: Watt Mirza, MD  No chief complaint on file.  Subjective:   Joyce Rowe is a 59 y.o. very pleasant female patient with There is no height or weight on file to calculate BMI. who presents with the following:  Discussed the use of AI scribe software for clinical note transcription with the patient, who gave verbal consent to proceed.  [name] presents with ongoing leg and knee pain.  I last saw her July 26, 2024.  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  At that point, was also some concern about the pain in the calf.   History of Present Illness     Review of Systems is noted in the HPI, as appropriate  Objective:   LMP 11/28/2020   GEN: No acute distress; alert,appropriate. PULM: Breathing comfortably in no respiratory distress PSYCH: Normally interactive.   Laboratory and Imaging Data:  Assessment and Plan:   No diagnosis found. Assessment & Plan   Medication Management during today's office visit: No orders of the defined types were placed in this encounter.  There are no discontinued medications.  Orders placed today for conditions managed today: No orders of the defined types were placed in this encounter.   Disposition: No follow-ups on file.  Dragon [employer] One speech-to-text software was used for transcription in this dictation.  Possible transcriptional errors can occur using Animal nutritionist.   Signed,  Mirza DASEN. [name], MD   Outpatient Encounter Medications as of 08/16/2024  Medication Sig   glucose blood (CONTOUR NEXT TEST) test strip Use to check blood sugar up to  2 times a day   hydrochlorothiazide  (HYDRODIURIL ) 12.5 MG tablet TAKE 1 TABLET BY MOUTH DAILY   lisinopril  (ZESTRIL ) 40 MG tablet TAKE 1 TABLET BY MOUTH DAILY   metFORMIN  (GLUCOPHAGE -XR) 500 MG 24 hr tablet Take 4 tablets (2,000 mg total) by mouth daily with breakfast.   Microlet Lancets MISC Use to check blood sugar up to 2 times a day   Multiple Vitamin (MULTIVITAMIN) tablet Take 1 tablet by mouth daily.   NONFORMULARY OR COMPOUNDED ITEM Epic Account: [id number] Lab Studies: BMP, CBC with diff, HFP: Z79.899 FLP: E78.5 Hemoglobin A1c: z13.1   predniSONE  (DELTASONE ) 20 MG tablet 2 tabs po daily for 5 days, then 1 tab po daily for 5 days   rosuvastatin  (CRESTOR ) 40 MG tablet Take 1 tablet (40 mg total) by mouth daily.   Semaglutide , 2 MG/DOSE, (OZEMPIC , 2 MG/DOSE,) 8 MG/3ML SOPN Inject 2 mg into the skin once a week.   No facility-administered encounter medications on file as of 08/16/2024.   "

## 2024-08-16 ENCOUNTER — Encounter: Payer: Self-pay | Admitting: Family Medicine

## 2024-08-16 ENCOUNTER — Ambulatory Visit: Admitting: Family Medicine

## 2024-08-16 VITALS — BP 164/80 | HR 110 | Temp 97.1°F | Ht 67.0 in | Wt 213.5 lb

## 2024-08-16 DIAGNOSIS — M79604 Pain in right leg: Secondary | ICD-10-CM | POA: Diagnosis not present

## 2024-08-16 DIAGNOSIS — M1711 Unilateral primary osteoarthritis, right knee: Secondary | ICD-10-CM

## 2024-08-16 MED ORDER — TRIAMCINOLONE ACETONIDE 40 MG/ML IJ SUSP
40.0000 mg | Freq: Once | INTRAMUSCULAR | Status: AC
  Administered 2024-08-16: 40 mg via INTRA_ARTICULAR
